# Patient Record
Sex: Male | Born: 1950 | Race: White | Hispanic: No | Marital: Married | Smoking: Never smoker
Health system: Southern US, Community
[De-identification: ages and names within clinical notes are randomized; demographics above are authoritative.]

## PROBLEM LIST (undated history)

## (undated) DIAGNOSIS — G2581 Restless legs syndrome: Secondary | ICD-10-CM

## (undated) DIAGNOSIS — I2510 Atherosclerotic heart disease of native coronary artery without angina pectoris: Secondary | ICD-10-CM

## (undated) DIAGNOSIS — E119 Type 2 diabetes mellitus without complications: Secondary | ICD-10-CM

## (undated) DIAGNOSIS — N189 Chronic kidney disease, unspecified: Secondary | ICD-10-CM

## (undated) DIAGNOSIS — K635 Polyp of colon: Secondary | ICD-10-CM

## (undated) DIAGNOSIS — I10 Essential (primary) hypertension: Secondary | ICD-10-CM

## (undated) DIAGNOSIS — E669 Obesity, unspecified: Secondary | ICD-10-CM

## (undated) DIAGNOSIS — S21332A Puncture wound without foreign body of left front wall of thorax with penetration into thoracic cavity, initial encounter: Secondary | ICD-10-CM

## (undated) DIAGNOSIS — E785 Hyperlipidemia, unspecified: Secondary | ICD-10-CM

## (undated) HISTORY — PX: CORONARY ARTERY BYPASS GRAFT: SHX141

## (undated) HISTORY — DX: Puncture wound without foreign body of left front wall of thorax with penetration into thoracic cavity, initial encounter: S21.332A

## (undated) HISTORY — PX: COLONOSCOPY: SHX174

## (undated) HISTORY — PX: OTHER SURGICAL HISTORY: SHX169

## (undated) HISTORY — PX: TONSILLECTOMY: SUR1361

## (undated) HISTORY — PX: EYE SURGERY: SHX253

---

## 2008-06-29 ENCOUNTER — Emergency Department: Payer: Self-pay | Admitting: Emergency Medicine

## 2012-11-08 DIAGNOSIS — I2510 Atherosclerotic heart disease of native coronary artery without angina pectoris: Secondary | ICD-10-CM | POA: Insufficient documentation

## 2016-05-09 DIAGNOSIS — E1159 Type 2 diabetes mellitus with other circulatory complications: Secondary | ICD-10-CM | POA: Diagnosis present

## 2016-08-15 DIAGNOSIS — I25810 Atherosclerosis of coronary artery bypass graft(s) without angina pectoris: Secondary | ICD-10-CM | POA: Diagnosis present

## 2018-01-05 ENCOUNTER — Ambulatory Visit
Admission: RE | Admit: 2018-01-05 | Payer: Commercial Managed Care - PPO | Source: Ambulatory Visit | Admitting: Internal Medicine

## 2018-01-05 ENCOUNTER — Encounter: Payer: Self-pay | Admitting: Anesthesiology

## 2018-01-05 ENCOUNTER — Encounter: Admission: RE | Payer: Self-pay | Source: Ambulatory Visit

## 2018-01-05 HISTORY — DX: Restless legs syndrome: G25.81

## 2018-01-05 HISTORY — DX: Obesity, unspecified: E66.9

## 2018-01-05 HISTORY — DX: Type 2 diabetes mellitus without complications: E11.9

## 2018-01-05 HISTORY — DX: Essential (primary) hypertension: I10

## 2018-01-05 HISTORY — DX: Chronic kidney disease, unspecified: N18.9

## 2018-01-05 HISTORY — DX: Polyp of colon: K63.5

## 2018-01-05 HISTORY — DX: Hyperlipidemia, unspecified: E78.5

## 2018-01-05 HISTORY — DX: Atherosclerotic heart disease of native coronary artery without angina pectoris: I25.10

## 2018-01-05 SURGERY — COLONOSCOPY WITH PROPOFOL
Anesthesia: General

## 2019-01-19 DIAGNOSIS — I739 Peripheral vascular disease, unspecified: Secondary | ICD-10-CM | POA: Insufficient documentation

## 2020-02-02 ENCOUNTER — Ambulatory Visit: Payer: Medicare Other | Attending: Internal Medicine

## 2020-02-02 ENCOUNTER — Other Ambulatory Visit: Payer: Self-pay

## 2020-02-02 DIAGNOSIS — Z23 Encounter for immunization: Secondary | ICD-10-CM | POA: Insufficient documentation

## 2020-02-02 NOTE — Progress Notes (Signed)
   Covid-19 Vaccination Clinic  Name:  Bob Miller    MRN: [id number] DOB: June 21, 1951  02/02/2020  Bob Miller was observed post Covid-19 immunization for 15 minutes without incidence. He was provided with Vaccine Information Sheet and instruction to access the V-Safe system.   Bob Miller was instructed to call 911 with any severe reactions post vaccine: Marland Kitchen Difficulty breathing  . Swelling of your face and throat  . A fast heartbeat  . A bad rash all over your body  . Dizziness and weakness    Immunizations Administered    Name Date Dose VIS Date Route   Moderna COVID-19 Vaccine 02/02/2020  4:03 PM 0.5 mL 11/28/2019 Intramuscular   Manufacturer: Moderna   Lot: [id number]   [id number]

## 2020-03-05 ENCOUNTER — Ambulatory Visit: Payer: Medicare Other | Attending: Internal Medicine

## 2020-03-05 DIAGNOSIS — Z23 Encounter for immunization: Secondary | ICD-10-CM | POA: Insufficient documentation

## 2020-03-05 NOTE — Progress Notes (Signed)
   Covid-19 Vaccination Clinic  Name:  Bob Miller    MRN: [id number] DOB: March 08, 1951  03/05/2020  [name] was observed post Covid-19 immunization for 15 minutes without incident. He was provided with Vaccine Information Sheet and instruction to access the V-Safe system.   [name] was instructed to call 911 with any severe reactions post vaccine: Marland Kitchen Difficulty breathing  . Swelling of face and throat  . A fast heartbeat  . A bad rash all over body  . Dizziness and weakness   Immunizations Administered    Name Date Dose VIS Date Route   Moderna COVID-19 Vaccine 03/05/2020  3:44 PM 0.5 mL 11/28/2019 Intramuscular   Manufacturer: Moderna   Lot: [id number]   [id number]

## 2020-07-08 ENCOUNTER — Other Ambulatory Visit: Payer: Self-pay

## 2020-07-15 NOTE — Discharge Instructions (Signed)

## 2020-07-17 ENCOUNTER — Encounter
Admission: RE | Disposition: A | Discharge status: Home / Self Care | Payer: Self-pay | Source: Home / Self Care | Attending: Ophthalmology

## 2020-07-17 ENCOUNTER — Other Ambulatory Visit: Payer: Self-pay

## 2020-07-17 ENCOUNTER — Ambulatory Visit: Payer: Medicare Other | Admitting: Anesthesiology

## 2020-07-17 ENCOUNTER — Encounter: Payer: Self-pay | Admitting: Ophthalmology

## 2020-07-17 ENCOUNTER — Ambulatory Visit
Admission: RE | Admit: 2020-07-17 | Discharge: 2020-07-17 | Disposition: A | Discharge status: Home / Self Care | Payer: Medicare Other | Attending: Ophthalmology | Admitting: Ophthalmology

## 2020-07-17 DIAGNOSIS — Z7982 Long term (current) use of aspirin: Secondary | ICD-10-CM | POA: Insufficient documentation

## 2020-07-17 DIAGNOSIS — I252 Old myocardial infarction: Secondary | ICD-10-CM | POA: Insufficient documentation

## 2020-07-17 DIAGNOSIS — Z79899 Other long term (current) drug therapy: Secondary | ICD-10-CM | POA: Diagnosis not present

## 2020-07-17 DIAGNOSIS — E7800 Pure hypercholesterolemia, unspecified: Secondary | ICD-10-CM | POA: Insufficient documentation

## 2020-07-17 DIAGNOSIS — E1136 Type 2 diabetes mellitus with diabetic cataract: Secondary | ICD-10-CM | POA: Insufficient documentation

## 2020-07-17 DIAGNOSIS — Z7984 Long term (current) use of oral hypoglycemic drugs: Secondary | ICD-10-CM | POA: Diagnosis not present

## 2020-07-17 DIAGNOSIS — I10 Essential (primary) hypertension: Secondary | ICD-10-CM | POA: Diagnosis not present

## 2020-07-17 DIAGNOSIS — H9190 Unspecified hearing loss, unspecified ear: Secondary | ICD-10-CM | POA: Insufficient documentation

## 2020-07-17 DIAGNOSIS — Z951 Presence of aortocoronary bypass graft: Secondary | ICD-10-CM | POA: Diagnosis not present

## 2020-07-17 DIAGNOSIS — I2510 Atherosclerotic heart disease of native coronary artery without angina pectoris: Secondary | ICD-10-CM | POA: Diagnosis not present

## 2020-07-17 DIAGNOSIS — H2511 Age-related nuclear cataract, right eye: Secondary | ICD-10-CM | POA: Diagnosis not present

## 2020-07-17 DIAGNOSIS — Z791 Long term (current) use of non-steroidal anti-inflammatories (NSAID): Secondary | ICD-10-CM | POA: Insufficient documentation

## 2020-07-17 DIAGNOSIS — Z955 Presence of coronary angioplasty implant and graft: Secondary | ICD-10-CM | POA: Diagnosis not present

## 2020-07-17 HISTORY — PX: CATARACT EXTRACTION W/PHACO: SHX586

## 2020-07-17 LAB — GLUCOSE, CAPILLARY
Glucose-Capillary: 170 mg/dL — ABNORMAL HIGH (ref 70–99)
Glucose-Capillary: 186 mg/dL — ABNORMAL HIGH (ref 70–99)

## 2020-07-17 SURGERY — PHACOEMULSIFICATION, CATARACT, WITH IOL INSERTION
Anesthesia: Monitor Anesthesia Care | Site: Eye | Laterality: Right

## 2020-07-17 MED ORDER — EPINEPHRINE PF 1 MG/ML IJ SOLN
INTRAOCULAR | Status: DC | PRN
  Administered 2020-07-17: 92 mL via OPHTHALMIC

## 2020-07-17 MED ORDER — MIDAZOLAM HCL 2 MG/2ML IJ SOLN
INTRAMUSCULAR | Status: DC | PRN
  Administered 2020-07-17: 2 mg via INTRAVENOUS

## 2020-07-17 MED ORDER — LACTATED RINGERS IV SOLN: INTRAVENOUS | Status: DC

## 2020-07-17 MED ORDER — NA HYALUR & NA CHOND-NA HYALUR 0.4-0.35 ML IO KIT
PACK | INTRAOCULAR | Status: DC | PRN
  Administered 2020-07-17: 1 mL via INTRAOCULAR

## 2020-07-17 MED ORDER — FENTANYL CITRATE (PF) 100 MCG/2ML IJ SOLN
INTRAMUSCULAR | Status: DC | PRN
  Administered 2020-07-17: 100 ug via INTRAVENOUS

## 2020-07-17 MED ORDER — ARMC OPHTHALMIC DILATING DROPS
1.0000 "application " | OPHTHALMIC | Status: DC | PRN
  Administered 2020-07-17 (×3): 1 via OPHTHALMIC

## 2020-07-17 MED ORDER — TETRACAINE HCL 0.5 % OP SOLN
1.0000 [drp] | OPHTHALMIC | Status: DC | PRN
  Administered 2020-07-17 (×3): 1 [drp] via OPHTHALMIC

## 2020-07-17 MED ORDER — ACETAMINOPHEN 325 MG PO TABS: 325.0000 mg | ORAL_TABLET | ORAL | Status: DC | PRN

## 2020-07-17 MED ORDER — BRIMONIDINE TARTRATE-TIMOLOL 0.2-0.5 % OP SOLN
OPHTHALMIC | Status: DC | PRN
  Administered 2020-07-17: 1 [drp] via OPHTHALMIC

## 2020-07-17 MED ORDER — MOXIFLOXACIN HCL 0.5 % OP SOLN
1.0000 [drp] | OPHTHALMIC | Status: DC | PRN
  Administered 2020-07-17 (×3): 1 [drp] via OPHTHALMIC

## 2020-07-17 MED ORDER — CEFUROXIME OPHTHALMIC INJECTION 1 MG/0.1 ML
INJECTION | OPHTHALMIC | Status: DC | PRN
  Administered 2020-07-17: 0.1 mL via INTRACAMERAL

## 2020-07-17 MED ORDER — LIDOCAINE HCL (PF) 2 % IJ SOLN
INTRAOCULAR | Status: DC | PRN
  Administered 2020-07-17: 1 mL

## 2020-07-17 MED ORDER — ACETAMINOPHEN 160 MG/5ML PO SOLN: 325.0000 mg | ORAL | Status: DC | PRN

## 2020-07-17 SURGICAL SUPPLY — 29 items
CANNULA ANT/CHMB 27G (MISCELLANEOUS) ×1 IMPLANT
CANNULA ANT/CHMB 27GA (MISCELLANEOUS) ×3 IMPLANT
GLOVE SURG LX 7.5 STRW (GLOVE) ×4
GLOVE SURG LX STRL 7.5 STRW (GLOVE) ×1 IMPLANT
GLOVE SURG TRIUMPH 8.0 PF LTX (GLOVE) ×3 IMPLANT
GOWN STRL REUS W/ TWL LRG LVL3 (GOWN DISPOSABLE) ×2 IMPLANT
GOWN STRL REUS W/TWL LRG LVL3 (GOWN DISPOSABLE) ×6
LENS IOL DIOP 20.5 (Intraocular Lens) ×3 IMPLANT
LENS IOL TECNIS MONO 20.5 (Intraocular Lens) IMPLANT
MARKER SKIN DUAL TIP RULER LAB (MISCELLANEOUS) ×3 IMPLANT
NDL CAPSULORHEX 25GA (NEEDLE) ×1 IMPLANT
NDL FILTER BLUNT 18X1 1/2 (NEEDLE) ×2 IMPLANT
NDL RETROBULBAR .5 NSTRL (NEEDLE) IMPLANT
NEEDLE CAPSULORHEX 25GA (NEEDLE) ×3 IMPLANT
NEEDLE FILTER BLUNT 18X 1/2SAF (NEEDLE) ×4
NEEDLE FILTER BLUNT 18X1 1/2 (NEEDLE) ×2 IMPLANT
PACK CATARACT BRASINGTON (MISCELLANEOUS) ×3 IMPLANT
PACK EYE AFTER SURG (MISCELLANEOUS) ×3 IMPLANT
PACK OPTHALMIC (MISCELLANEOUS) ×3 IMPLANT
RING MALYGIN 7.0 (MISCELLANEOUS) IMPLANT
SOLUTION OPHTHALMIC SALT (MISCELLANEOUS) ×3 IMPLANT
SUT ETHILON 10-0 CS-B-6CS-B-6 (SUTURE)
SUT VICRYL  9 0 (SUTURE)
SUT VICRYL 9 0 (SUTURE) IMPLANT
SUTURE EHLN 10-0 CS-B-6CS-B-6 (SUTURE) IMPLANT
SYR 3ML LL SCALE MARK (SYRINGE) ×6 IMPLANT
SYR TB 1ML LUER SLIP (SYRINGE) ×3 IMPLANT
WATER STERILE IRR 250ML POUR (IV SOLUTION) ×3 IMPLANT
WIPE NON LINTING 3.25X3.25 (MISCELLANEOUS) ×3 IMPLANT

## 2020-07-17 NOTE — H&P (Signed)

## 2020-07-17 NOTE — Op Note (Signed)
LOCATION:  [place]   PREOPERATIVE DIAGNOSIS:    Nuclear sclerotic cataract right eye. H25.11   POSTOPERATIVE DIAGNOSIS:  Nuclear sclerotic cataract right eye.     PROCEDURE:  Phacoemusification with posterior chamber intraocular lens placement of the right eye   ULTRASOUND TIME: Procedure(s) with comments: CATARACT EXTRACTION PHACO AND INTRAOCULAR LENS PLACEMENT (IOC) RIGHT DIABETIC (Right) - 6.45 1:30.6 7.1%  LENS:   Implant Name Type Inv. Item Serial No. Manufacturer Lot No. LRB No. Used Action  LENS IOL DIOP 20.5 - [id number] Intraocular Lens LENS IOL DIOP 20.5 [id number] [employer]  Right 1 Implanted         SURGEON:  Wyonia Hough, MD   ANESTHESIA:  Topical with tetracaine drops and 2% Xylocaine jelly, augmented with 1% preservative-free intracameral lidocaine.    COMPLICATIONS:  None.   DESCRIPTION OF PROCEDURE:  The patient was identified in the holding room and transported to the operating room and placed in the supine position under the operating microscope.  The right eye was identified as the operative eye and it was prepped and draped in the usual sterile ophthalmic fashion.   A 1 millimeter clear-corneal paracentesis was made at the 12:00 position.  0.5 ml of preservative-free 1% lidocaine was injected into the anterior chamber. The anterior chamber was filled with Viscoat viscoelastic.  A 2.4 millimeter keratome was used to make a near-clear corneal incision at the 9:00 position.  A curvilinear capsulorrhexis was made with a cystotome and capsulorrhexis forceps.  Balanced salt solution was used to hydrodissect and hydrodelineate the nucleus.   Phacoemulsification was then used in stop and chop fashion to remove the lens nucleus and epinucleus.  The remaining cortex was then removed using the irrigation and aspiration handpiece. Provisc was then placed into the capsular bag to distend it for lens placement.  A lens was then injected  into the capsular bag.  The remaining viscoelastic was aspirated.   Wounds were hydrated with balanced salt solution.  The anterior chamber was inflated to a physiologic pressure with balanced salt solution.  No wound leaks were noted. Cefuroxime 0.1 ml of a 10mg /ml solution was injected into the anterior chamber for a dose of 1 mg of intracameral antibiotic at the completion of the case.   Timolol and Brimonidine drops were applied to the eye.  The patient was taken to the recovery room in stable condition without complications of anesthesia or surgery.   Njeri Vicente 07/17/2020, 8:35 AM

## 2020-07-17 NOTE — Anesthesia Postprocedure Evaluation (Signed)
Anesthesia Post Note  Patient: Bob Miller  Procedure(s) Performed: CATARACT EXTRACTION PHACO AND INTRAOCULAR LENS PLACEMENT (IOC) RIGHT DIABETIC (Right Eye)     Patient location during evaluation: PACU Anesthesia Type: MAC Level of consciousness: awake and alert Pain management: pain level controlled Vital Signs Assessment: post-procedure vital signs reviewed and stable Respiratory status: spontaneous breathing, nonlabored ventilation, respiratory function stable and patient connected to nasal cannula oxygen Cardiovascular status: stable and blood pressure returned to baseline Postop Assessment: no apparent nausea or vomiting Anesthetic complications: no   No complications documented.  Trecia Rogers

## 2020-07-17 NOTE — Anesthesia Preprocedure Evaluation (Signed)
Anesthesia Evaluation  Patient identified by MRN, date of birth, ID band Patient awake    Reviewed: Allergy & Precautions, H&P , NPO status , Patient's Chart, lab work & pertinent test results, reviewed documented beta blocker date and time   Airway Mallampati: II  TM Distance: >3 FB Neck ROM: full    Dental no notable dental hx.    Pulmonary neg pulmonary ROS,    Pulmonary exam normal breath sounds clear to auscultation       Cardiovascular Exercise Tolerance: Good hypertension, + CAD and + Cardiac Stents   Rhythm:regular Rate:Normal     Neuro/Psych negative neurological ROS  negative psych ROS   GI/Hepatic negative GI ROS, Neg liver ROS,   Endo/Other  diabetes  Renal/GU negative Renal ROS  negative genitourinary   Musculoskeletal   Abdominal   Peds  Hematology negative hematology ROS (+)   Anesthesia Other Findings   Reproductive/Obstetrics negative OB ROS                             Anesthesia Physical Anesthesia Plan  ASA: II  Anesthesia Plan: MAC   Post-op Pain Management:    Induction:   PONV Risk Score and Plan:   Airway Management Planned:   Additional Equipment:   Intra-op Plan:   Post-operative Plan:   Informed Consent: I have reviewed the patients History and Physical, chart, labs and discussed the procedure including the risks, benefits and alternatives for the proposed anesthesia with the patient or authorized representative who has indicated his/her understanding and acceptance.     Dental Advisory Given  Plan Discussed with: CRNA and Anesthesiologist  Anesthesia Plan Comments:         Anesthesia Quick Evaluation

## 2020-07-17 NOTE — Transfer of Care (Signed)
Immediate Anesthesia Transfer of Care Note  Patient: Bob Miller  Procedure(s) Performed: CATARACT EXTRACTION PHACO AND INTRAOCULAR LENS PLACEMENT (IOC) RIGHT DIABETIC (Right Eye)  Patient Location: PACU  Anesthesia Type: MAC  Level of Consciousness: awake, alert  and patient cooperative  Airway and Oxygen Therapy: Patient Spontanous Breathing and Patient connected to supplemental oxygen  Post-op Assessment: Post-op Vital signs reviewed, Patient's Cardiovascular Status Stable, Respiratory Function Stable, Patent Airway and No signs of Nausea or vomiting  Post-op Vital Signs: Reviewed and stable  Complications: No complications documented.

## 2020-07-18 ENCOUNTER — Encounter: Payer: Self-pay | Admitting: Ophthalmology

## 2020-08-01 ENCOUNTER — Encounter: Payer: Self-pay | Admitting: Ophthalmology

## 2020-08-01 ENCOUNTER — Other Ambulatory Visit: Payer: Self-pay

## 2020-08-05 ENCOUNTER — Inpatient Hospital Stay: Admission: RE | Admit: 2020-08-05 | Payer: Medicare Other | Source: Ambulatory Visit

## 2020-08-21 ENCOUNTER — Ambulatory Visit: Admit: 2020-08-21 | Payer: Medicare Other | Admitting: Ophthalmology

## 2020-08-21 SURGERY — PHACOEMULSIFICATION, CATARACT, WITH IOL INSERTION
Anesthesia: Topical | Laterality: Left

## 2020-08-28 ENCOUNTER — Encounter: Payer: Self-pay | Admitting: Ophthalmology

## 2020-08-28 ENCOUNTER — Other Ambulatory Visit: Payer: Self-pay

## 2020-08-30 ENCOUNTER — Other Ambulatory Visit: Admission: RE | Admit: 2020-08-30 | Payer: Medicare Other | Source: Ambulatory Visit

## 2020-09-03 ENCOUNTER — Other Ambulatory Visit: Payer: Self-pay

## 2020-09-03 ENCOUNTER — Other Ambulatory Visit
Admission: RE | Admit: 2020-09-03 | Discharge: 2020-09-03 | Disposition: A | Discharge status: Home / Self Care | Payer: Medicare Other | Source: Ambulatory Visit | Attending: Ophthalmology | Admitting: Ophthalmology

## 2020-09-03 DIAGNOSIS — Z01812 Encounter for preprocedural laboratory examination: Secondary | ICD-10-CM | POA: Insufficient documentation

## 2020-09-03 DIAGNOSIS — Z20822 Contact with and (suspected) exposure to covid-19: Secondary | ICD-10-CM | POA: Insufficient documentation

## 2020-09-03 NOTE — Discharge Instructions (Signed)

## 2020-09-04 ENCOUNTER — Encounter: Payer: Self-pay | Admitting: Ophthalmology

## 2020-09-04 ENCOUNTER — Encounter
Admission: RE | Disposition: A | Discharge status: Home / Self Care | Payer: Self-pay | Source: Home / Self Care | Attending: Ophthalmology

## 2020-09-04 ENCOUNTER — Encounter: Payer: Self-pay | Admitting: Anesthesiology

## 2020-09-04 ENCOUNTER — Other Ambulatory Visit: Payer: Self-pay

## 2020-09-04 ENCOUNTER — Ambulatory Visit
Admission: RE | Admit: 2020-09-04 | Discharge: 2020-09-04 | Disposition: A | Discharge status: Home / Self Care | Payer: Medicare Other | Attending: Ophthalmology | Admitting: Ophthalmology

## 2020-09-04 DIAGNOSIS — H5703 Miosis: Secondary | ICD-10-CM | POA: Diagnosis not present

## 2020-09-04 DIAGNOSIS — Z794 Long term (current) use of insulin: Secondary | ICD-10-CM | POA: Insufficient documentation

## 2020-09-04 DIAGNOSIS — M1990 Unspecified osteoarthritis, unspecified site: Secondary | ICD-10-CM | POA: Insufficient documentation

## 2020-09-04 DIAGNOSIS — E1136 Type 2 diabetes mellitus with diabetic cataract: Secondary | ICD-10-CM | POA: Diagnosis present

## 2020-09-04 DIAGNOSIS — Z951 Presence of aortocoronary bypass graft: Secondary | ICD-10-CM | POA: Diagnosis not present

## 2020-09-04 DIAGNOSIS — I2510 Atherosclerotic heart disease of native coronary artery without angina pectoris: Secondary | ICD-10-CM | POA: Insufficient documentation

## 2020-09-04 DIAGNOSIS — H2512 Age-related nuclear cataract, left eye: Secondary | ICD-10-CM | POA: Diagnosis not present

## 2020-09-04 DIAGNOSIS — I252 Old myocardial infarction: Secondary | ICD-10-CM | POA: Insufficient documentation

## 2020-09-04 DIAGNOSIS — Z955 Presence of coronary angioplasty implant and graft: Secondary | ICD-10-CM | POA: Diagnosis not present

## 2020-09-04 DIAGNOSIS — Z7982 Long term (current) use of aspirin: Secondary | ICD-10-CM | POA: Insufficient documentation

## 2020-09-04 DIAGNOSIS — Z720 Tobacco use: Secondary | ICD-10-CM | POA: Insufficient documentation

## 2020-09-04 DIAGNOSIS — E7800 Pure hypercholesterolemia, unspecified: Secondary | ICD-10-CM | POA: Insufficient documentation

## 2020-09-04 DIAGNOSIS — Z79899 Other long term (current) drug therapy: Secondary | ICD-10-CM | POA: Diagnosis not present

## 2020-09-04 HISTORY — PX: CATARACT EXTRACTION W/PHACO: SHX586

## 2020-09-04 LAB — GLUCOSE, CAPILLARY
Glucose-Capillary: 164 mg/dL — ABNORMAL HIGH (ref 70–99)
Glucose-Capillary: 164 mg/dL — ABNORMAL HIGH (ref 70–99)

## 2020-09-04 LAB — SARS CORONAVIRUS 2 (TAT 6-24 HRS): SARS Coronavirus 2: NEGATIVE

## 2020-09-04 SURGERY — PHACOEMULSIFICATION, CATARACT, WITH IOL INSERTION
Anesthesia: Monitor Anesthesia Care | Site: Eye | Laterality: Left

## 2020-09-04 MED ORDER — CEFUROXIME OPHTHALMIC INJECTION 1 MG/0.1 ML
INJECTION | OPHTHALMIC | Status: DC | PRN
  Administered 2020-09-04: 0.1 mL via INTRACAMERAL

## 2020-09-04 MED ORDER — NA HYALUR & NA CHOND-NA HYALUR 0.4-0.35 ML IO KIT
PACK | INTRAOCULAR | Status: DC | PRN
  Administered 2020-09-04: 1 mL via INTRAOCULAR

## 2020-09-04 MED ORDER — LIDOCAINE HCL (PF) 2 % IJ SOLN
INTRAOCULAR | Status: DC | PRN
  Administered 2020-09-04: 1 mL

## 2020-09-04 MED ORDER — MOXIFLOXACIN HCL 0.5 % OP SOLN
1.0000 [drp] | OPHTHALMIC | Status: DC | PRN
  Administered 2020-09-04 (×3): 1 [drp] via OPHTHALMIC

## 2020-09-04 MED ORDER — FENTANYL CITRATE (PF) 100 MCG/2ML IJ SOLN
INTRAMUSCULAR | Status: DC | PRN
Start: 2020-09-04 — End: 2020-09-04
  Administered 2020-09-04 (×2): 50 ug via INTRAVENOUS

## 2020-09-04 MED ORDER — TETRACAINE HCL 0.5 % OP SOLN
1.0000 [drp] | OPHTHALMIC | Status: DC | PRN
  Administered 2020-09-04 (×3): 1 [drp] via OPHTHALMIC

## 2020-09-04 MED ORDER — LACTATED RINGERS IV SOLN: INTRAVENOUS | Status: DC

## 2020-09-04 MED ORDER — EPINEPHRINE PF 1 MG/ML IJ SOLN
INTRAOCULAR | Status: DC | PRN
  Administered 2020-09-04: 70 mL via OPHTHALMIC

## 2020-09-04 MED ORDER — MIDAZOLAM HCL 2 MG/2ML IJ SOLN
INTRAMUSCULAR | Status: DC | PRN
  Administered 2020-09-04 (×2): 1 mg via INTRAVENOUS

## 2020-09-04 MED ORDER — BRIMONIDINE TARTRATE-TIMOLOL 0.2-0.5 % OP SOLN
OPHTHALMIC | Status: DC | PRN
  Administered 2020-09-04: 1 [drp] via OPHTHALMIC

## 2020-09-04 MED ORDER — ARMC OPHTHALMIC DILATING DROPS
1.0000 "application " | OPHTHALMIC | Status: DC | PRN
  Administered 2020-09-04 (×3): 1 via OPHTHALMIC

## 2020-09-04 SURGICAL SUPPLY — 24 items
CANNULA ANT/CHMB 27G (MISCELLANEOUS) ×1 IMPLANT
CANNULA ANT/CHMB 27GA (MISCELLANEOUS) ×3 IMPLANT
GLOVE SURG LX 7.5 STRW (GLOVE) ×2
GLOVE SURG LX STRL 7.5 STRW (GLOVE) ×1 IMPLANT
GLOVE SURG TRIUMPH 8.0 PF LTX (GLOVE) ×3 IMPLANT
GOWN STRL REUS W/ TWL LRG LVL3 (GOWN DISPOSABLE) ×2 IMPLANT
GOWN STRL REUS W/TWL LRG LVL3 (GOWN DISPOSABLE) ×6
LENS IOL DIOP 20.0 (Intraocular Lens) ×3 IMPLANT
LENS IOL TECNIS MONO 20.0 (Intraocular Lens) IMPLANT
MARKER SKIN DUAL TIP RULER LAB (MISCELLANEOUS) ×3 IMPLANT
NDL CAPSULORHEX 25GA (NEEDLE) ×1 IMPLANT
NDL FILTER BLUNT 18X1 1/2 (NEEDLE) ×2 IMPLANT
NEEDLE CAPSULORHEX 25GA (NEEDLE) ×3 IMPLANT
NEEDLE FILTER BLUNT 18X 1/2SAF (NEEDLE) ×4
NEEDLE FILTER BLUNT 18X1 1/2 (NEEDLE) ×2 IMPLANT
PACK CATARACT BRASINGTON (MISCELLANEOUS) ×3 IMPLANT
PACK EYE AFTER SURG (MISCELLANEOUS) ×3 IMPLANT
PACK OPTHALMIC (MISCELLANEOUS) ×3 IMPLANT
RING MALYGIN (MISCELLANEOUS) ×2 IMPLANT
SOLUTION OPHTHALMIC SALT (MISCELLANEOUS) ×3 IMPLANT
SYR 3ML LL SCALE MARK (SYRINGE) ×6 IMPLANT
SYR TB 1ML LUER SLIP (SYRINGE) ×3 IMPLANT
WATER STERILE IRR 250ML POUR (IV SOLUTION) ×3 IMPLANT
WIPE NON LINTING 3.25X3.25 (MISCELLANEOUS) ×3 IMPLANT

## 2020-09-04 NOTE — H&P (Signed)

## 2020-09-04 NOTE — Anesthesia Postprocedure Evaluation (Signed)
Anesthesia Post Note  Patient: Bob Miller  Procedure(s) Performed: CATARACT EXTRACTION PHACO AND INTRAOCULAR LENS PLACEMENT (IOC) LEFT DIABETIC 8.07  00:55.2  14.6% (Left Eye)     Anesthesia Post Evaluation No complications documented.  Kiron Osmun [employer]

## 2020-09-04 NOTE — Anesthesia Preprocedure Evaluation (Signed)
Anesthesia Evaluation  Patient identified by MRN, date of birth, ID band Patient awake    Reviewed: Allergy & Precautions, H&P , NPO status , Patient's Chart, lab work & pertinent test results, reviewed documented beta blocker date and time   Airway Mallampati: II  TM Distance: >3 FB Neck ROM: full    Dental no notable dental hx.    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  Renal/GU CRFRenal disease  negative genitourinary   Musculoskeletal negative musculoskeletal ROS (+)   Abdominal (+) + obese,   Peds  Hematology negative hematology ROS (+)   Anesthesia Other Findings   Reproductive/Obstetrics                             Anesthesia Physical  Anesthesia Plan  ASA: III  Anesthesia Plan: MAC   Post-op Pain Management:    Induction:   PONV Risk Score and Plan: 1 and TIVA, Midazolam and Treatment may vary due to age or medical condition  Airway Management Planned: Natural Airway and Nasal Cannula  Additional Equipment: None  Intra-op Plan:   Post-operative Plan:   Informed Consent: I have reviewed the patients History and Physical, chart, labs and discussed the procedure including the risks, benefits and alternatives for the proposed anesthesia with the patient or authorized representative who has indicated his/her understanding and acceptance.     Dental advisory given  Plan Discussed with: CRNA  Anesthesia Plan Comments:         Anesthesia Quick Evaluation

## 2020-09-04 NOTE — Anesthesia Procedure Notes (Signed)
Procedure Name: MAC Performed by: Kierstynn Babich, CRNA Pre-anesthesia Checklist: Patient identified, Emergency Drugs available, Suction available, Timeout performed and Patient being monitored Patient Re-evaluated:Patient Re-evaluated prior to induction Oxygen Delivery Method: Nasal cannula Placement Confirmation: positive ETCO2       

## 2020-09-04 NOTE — Op Note (Signed)
OPERATIVE NOTE  Bob Miller [id number] 09/04/2020  PREOPERATIVE DIAGNOSIS:   Nuclear sclerotic cataract left eye with miotic pupil      H25.12   POSTOPERATIVE DIAGNOSIS:   Nuclear sclerotic cataract left eye with miotic pupil.     PROCEDURE:  Phacoemulsification with posterior chamber intraocular lens implantation of the left eye which required pupil stretching with the Malyugin pupil expansion device  Ultrasound time: Procedure(s) with comments: CATARACT EXTRACTION PHACO AND INTRAOCULAR LENS PLACEMENT (IOC) LEFT DIABETIC 8.07  00:55.2  14.6% (Left) - Diabetic - insulin and oral meds  LENS:   Implant Name Type Inv. Item Serial No. Manufacturer Lot No. LRB No. Used Action  LENS IOL DIOP 20.0 - [id number] Intraocular Lens LENS IOL DIOP 20.0 [id number] [employer]  Left 1 Implanted     SURGEON:  Wyonia Hough, MD   ANESTHESIA: Topical with tetracaine drops and 2% Xylocaine jelly, augmented with 1% preservative-free intracameral lidocaine.   COMPLICATIONS:  None.   DESCRIPTION OF PROCEDURE:  The patient was identified in the holding room and transported to the operating room and placed in the supine position under the operating microscope.  The left eye was identified as the operative eye and it was prepped and draped in the usual sterile ophthalmic fashion.   A 1 millimeter clear-corneal paracentesis was made at the 1:30 position.  The anterior chamber was filled with Viscoat viscoelastic.  0.5 ml of preservative-free 1% lidocaine was injected into the anterior chamber.  A 2.4 millimeter keratome was used to make a near-clear corneal incision at the 10:30 position.  A Malyugin pupil expander was then placed through the main incision and into the anterior chamber of the eye.  The edge of the iris was secured on the lip of the pupil expander and it was released, thereby expanding the pupil to approximately 6.5 millimeters for completion of the cataract surgery.  Additional  Viscoat was placed in the anterior chamber.  A cystotome and capsulorrhexis forceps were used to make a curvilinear capsulorrhexis.   Balanced salt solution was used to hydrodissect and hydrodelineate the lens nucleus.   Phacoemulsification was used in stop and chop fashion to remove the lens, nucleus and epinucleus.  The remaining cortex was aspirated using the irrigation aspiration handpiece.  Additional Provisc was placed into the eye to distend the capsular bag for lens placement.  A lens was then injected into the capsular bag.  The pupil expanding ring was removed using a Kuglen hook and insertion device. The remaining viscoelastic was aspirated from the capsular bag and the anterior chamber.  The anterior chamber was filled with balanced salt solution to inflate to a physiologic pressure.   Wounds were hydrated with balanced salt solution.  The anterior chamber was inflated to a physiologic pressure with balanced salt solution.  No wound leaks were noted. Cefuroxime 0.1 ml of a 10mg /ml solution was injected into the anterior chamber for a dose of 1 mg of intracameral antibiotic at the completion of the case.   Timolol and Brimonidine drops were applied to the eye.  The patient was taken to the recovery room in stable condition without complications of anesthesia or surgery.  Bob Miller 09/04/2020, 8:27 AM

## 2020-09-04 NOTE — Transfer of Care (Signed)
Immediate Anesthesia Transfer of Care Note  Patient: Bob Miller  Procedure(s) Performed: CATARACT EXTRACTION PHACO AND INTRAOCULAR LENS PLACEMENT (IOC) LEFT DIABETIC 8.07  00:55.2  14.6% (Left Eye)  Patient Location: PACU  Anesthesia Type: MAC  Level of Consciousness: awake, alert  and patient cooperative  Airway and Oxygen Therapy: Patient Spontanous Breathing and Patient connected to supplemental oxygen  Post-op Assessment: Post-op Vital signs reviewed, Patient's Cardiovascular Status Stable, Respiratory Function Stable, Patent Airway and No signs of Nausea or vomiting  Post-op Vital Signs: Reviewed and stable  Complications: No complications documented.

## 2020-09-05 ENCOUNTER — Encounter: Payer: Self-pay | Admitting: Ophthalmology

## 2021-05-01 ENCOUNTER — Other Ambulatory Visit: Admission: RE | Admit: 2021-05-01 | Payer: 59 | Source: Ambulatory Visit

## 2021-05-05 ENCOUNTER — Encounter
Admission: RE | Disposition: A | Discharge status: Home / Self Care | Payer: Self-pay | Source: Home / Self Care | Attending: Gastroenterology

## 2021-05-05 ENCOUNTER — Other Ambulatory Visit: Payer: Self-pay

## 2021-05-05 ENCOUNTER — Ambulatory Visit
Admission: RE | Admit: 2021-05-05 | Discharge: 2021-05-05 | Disposition: A | Discharge status: Home / Self Care | Payer: Medicare Other | Attending: Gastroenterology | Admitting: Gastroenterology

## 2021-05-05 ENCOUNTER — Ambulatory Visit: Payer: Medicare Other | Admitting: Anesthesiology

## 2021-05-05 ENCOUNTER — Encounter: Payer: Self-pay | Admitting: *Deleted

## 2021-05-05 DIAGNOSIS — Z7984 Long term (current) use of oral hypoglycemic drugs: Secondary | ICD-10-CM | POA: Diagnosis not present

## 2021-05-05 DIAGNOSIS — R1310 Dysphagia, unspecified: Secondary | ICD-10-CM | POA: Diagnosis present

## 2021-05-05 DIAGNOSIS — Z1211 Encounter for screening for malignant neoplasm of colon: Secondary | ICD-10-CM | POA: Insufficient documentation

## 2021-05-05 DIAGNOSIS — Z7982 Long term (current) use of aspirin: Secondary | ICD-10-CM | POA: Insufficient documentation

## 2021-05-05 DIAGNOSIS — Z79899 Other long term (current) drug therapy: Secondary | ICD-10-CM | POA: Insufficient documentation

## 2021-05-05 DIAGNOSIS — Z951 Presence of aortocoronary bypass graft: Secondary | ICD-10-CM | POA: Diagnosis not present

## 2021-05-05 DIAGNOSIS — Z7902 Long term (current) use of antithrombotics/antiplatelets: Secondary | ICD-10-CM | POA: Diagnosis not present

## 2021-05-05 DIAGNOSIS — Z791 Long term (current) use of non-steroidal anti-inflammatories (NSAID): Secondary | ICD-10-CM | POA: Insufficient documentation

## 2021-05-05 DIAGNOSIS — Z794 Long term (current) use of insulin: Secondary | ICD-10-CM | POA: Insufficient documentation

## 2021-05-05 DIAGNOSIS — D123 Benign neoplasm of transverse colon: Secondary | ICD-10-CM | POA: Insufficient documentation

## 2021-05-05 DIAGNOSIS — I2510 Atherosclerotic heart disease of native coronary artery without angina pectoris: Secondary | ICD-10-CM | POA: Diagnosis not present

## 2021-05-05 DIAGNOSIS — D122 Benign neoplasm of ascending colon: Secondary | ICD-10-CM | POA: Insufficient documentation

## 2021-05-05 DIAGNOSIS — Z955 Presence of coronary angioplasty implant and graft: Secondary | ICD-10-CM | POA: Insufficient documentation

## 2021-05-05 HISTORY — PX: COLONOSCOPY WITH PROPOFOL: SHX5780

## 2021-05-05 HISTORY — PX: ESOPHAGOGASTRODUODENOSCOPY (EGD) WITH PROPOFOL: SHX5813

## 2021-05-05 LAB — GLUCOSE, CAPILLARY: Glucose-Capillary: 199 mg/dL — ABNORMAL HIGH (ref 70–99)

## 2021-05-05 SURGERY — ESOPHAGOGASTRODUODENOSCOPY (EGD) WITH PROPOFOL
Anesthesia: General

## 2021-05-05 MED ORDER — PROPOFOL 500 MG/50ML IV EMUL
INTRAVENOUS | Status: AC
  Filled 2021-05-05: qty 50

## 2021-05-05 MED ORDER — PROPOFOL 10 MG/ML IV BOLUS
INTRAVENOUS | Status: AC
  Filled 2021-05-05: qty 20

## 2021-05-05 MED ORDER — LIDOCAINE HCL (PF) 2 % IJ SOLN
INTRAMUSCULAR | Status: AC
  Filled 2021-05-05: qty 2

## 2021-05-05 MED ORDER — LIDOCAINE HCL (CARDIAC) PF 100 MG/5ML IV SOSY
PREFILLED_SYRINGE | INTRAVENOUS | Status: DC | PRN
  Administered 2021-05-05: 60 mg via INTRAVENOUS

## 2021-05-05 MED ORDER — PROPOFOL 500 MG/50ML IV EMUL
INTRAVENOUS | Status: DC | PRN
  Administered 2021-05-05: 150 ug/kg/min via INTRAVENOUS

## 2021-05-05 MED ORDER — ONDANSETRON HCL 4 MG/2ML IJ SOLN: INTRAMUSCULAR | Status: DC | PRN

## 2021-05-05 MED ORDER — SODIUM CHLORIDE 0.9 % IV SOLN
INTRAVENOUS | Status: DC
  Administered 2021-05-05: 1000 mL via INTRAVENOUS

## 2021-05-05 NOTE — Interval H&P Note (Signed)
History and Physical Interval Note:  05/05/2021 10:04 AM  Bob Miller  has presented today for surgery, with the diagnosis of History of polyps, GERD, pharyngoesophageal dysphagia.  The various methods of treatment have been discussed with the patient and family. After consideration of risks, benefits and other options for treatment, the patient has consented to  Procedure(s) with comments: ESOPHAGOGASTRODUODENOSCOPY (EGD) WITH PROPOFOL (N/A) - DM COLONOSCOPY WITH PROPOFOL (N/A) as a surgical intervention.  The patient's history has been reviewed, patient examined, no change in status, stable for surgery.  I have reviewed the patient's chart and labs.  Questions were answered to the patient's satisfaction.     Lesly Rubenstein  Ok to proceed with EGD/Colonoscopy

## 2021-05-05 NOTE — H&P (Signed)
Outpatient short stay form Pre-procedure 05/05/2021 9:56 AM Raylene Miyamoto MD, MPH  Primary Physician: Dr. Celesta Aver  Reason for visit:  Dysphagia/Surveillance  History of present illness:   70 y/o gentleman with history of CAD on plavix with last dose 5 days ago per patient here for EGD/Colonoscopy. States he has chronic dysphagia to solids and liquids which has been going on for years. Had colonoscopy years ago with 5 polyps found. No abdominal surgeries. No family history of GI malignancies.    Current Facility-Administered Medications:  .  0.9 %  sodium chloride infusion, , Intravenous, Continuous, Westlee Devita, Hilton Cork, MD, Last Rate: 20 mL/hr at 05/05/21 0955, 1,000 mL at 05/05/21 0955  Medications Prior to Admission  Medication Sig Dispense Refill Last Dose  . amLODipine (NORVASC) 10 MG tablet Take 10 mg by mouth daily.   Past Week at Unknown time  . aspirin EC 81 MG tablet Take 81 mg by mouth daily.   Past Month at Unknown time  . co-enzyme Q-10 30 MG capsule Take 100 mg by mouth daily.    05/04/2021 at Unknown time  . Cyanocobalamin (VITAMIN B-12) 2500 MCG SUBL Place under the tongue.    05/04/2021 at Unknown time  . doxazosin (CARDURA) 2 MG tablet Take 2 mg by mouth daily.    Past Week at Unknown time  . glimepiride (AMARYL) 1 MG tablet Take 1 mg by mouth daily with breakfast.   Past Week at Unknown time  . glucosamine-chondroitin 500-400 MG tablet Take 1 tablet by mouth daily.   05/04/2021 at Unknown time  . Insulin Glargine (BASAGLAR KWIKPEN [place]) Inject 15 Units into the skin at bedtime.   Past Week at Unknown time  . isosorbide mononitrate (IMDUR) 30 MG 24 hr tablet Take 30 mg by mouth daily.   Past Week at Unknown time  . meloxicam (MOBIC) 15 MG tablet Take 15 mg by mouth daily.   Past Week at Unknown time  . metFORMIN (GLUCOPHAGE) 500 MG tablet Take by mouth daily with breakfast.    Past Week at Unknown time  . pantoprazole (PROTONIX) 40 MG tablet Take 40 mg by mouth daily.   Past  Week at Unknown time  . pioglitazone (ACTOS) 45 MG tablet Take 45 mg by mouth daily.    Past Week at Unknown time  . ramipril (ALTACE) 10 MG capsule Take 10 mg by mouth daily.   Past Week at Unknown time  . rosuvastatin (CRESTOR) 40 MG tablet Take 40 mg by mouth daily.   Past Week at Unknown time  . clopidogrel (PLAVIX) 75 MG tablet Take 75 mg by mouth daily.   05/01/2021  . metoprolol tartrate (LOPRESSOR) 50 MG tablet Take 50 mg by mouth 2 (two) times daily.     . nitroGLYCERIN (NITROSTAT) 0.4 MG SL tablet Place 0.4 mg under the tongue every 5 (five) minutes as needed for chest pain.     Marland Kitchen omeprazole (PRILOSEC) 40 MG capsule Take 40 mg by mouth daily.        Allergies  Allergen Reactions  . Codeine     anxiety  . Crestor [Rosuvastatin]     Muscle pain  . Lipitor [Atorvastatin]     Muscle pain  . Tramadol      Past Medical History:  Diagnosis Date  . Chronic kidney disease    stage 2  . Colon polyps   . Coronary artery disease   . Diabetes mellitus without complication ([place])   . Hyperlipidemia   .  Hypertension   . Obesity   . Restless leg syndrome     Review of systems:  Otherwise negative.    Physical Exam  Gen: Alert, oriented. Appears stated age.  HEENT: PERRLA. Lungs: No respiratory distress CV: RRR Abd: soft, benign, no masses Ext: No edema    Planned procedures: Proceed with EGD/colonoscopy. The patient understands the nature of the planned procedure, indications, risks, alternatives and potential complications including but not limited to bleeding, infection, perforation, damage to internal organs and possible oversedation/side effects from anesthesia. The patient agrees and gives consent to proceed.  Please refer to procedure notes for findings, recommendations and patient disposition/instructions.     Raylene Miyamoto MD, MPH Gastroenterology 05/05/2021  9:56 AM

## 2021-05-05 NOTE — Transfer of Care (Signed)
Immediate Anesthesia Transfer of Care Note  Patient: Bob Miller  Procedure(s) Performed: ESOPHAGOGASTRODUODENOSCOPY (EGD) WITH PROPOFOL (N/A ) COLONOSCOPY WITH PROPOFOL (N/A )  Patient Location: PACU  Anesthesia Type:General  Level of Consciousness: awake and sedated  Airway & Oxygen Therapy: Patient Spontanous Breathing and Patient connected to nasal cannula oxygen  Post-op Assessment: Report given to RN and Post -op Vital signs reviewed and stable  Post vital signs: Reviewed and stable  Last Vitals:  Vitals Value Taken Time  BP    Temp    Pulse    Resp    SpO2      Last Pain:  Vitals:   05/05/21 0938  TempSrc: Temporal  PainSc: 0-No pain         Complications: No complications documented.

## 2021-05-05 NOTE — Anesthesia Procedure Notes (Signed)
Performed by: Cook-Martin, Sharonann Malbrough Pre-anesthesia Checklist: Patient identified, Emergency Drugs available, Suction available, Patient being monitored and Timeout performed Patient Re-evaluated:Patient Re-evaluated prior to induction Oxygen Delivery Method: Simple face mask Preoxygenation: Pre-oxygenation with 100% oxygen Induction Type: IV induction Airway Equipment and Method: Bite block Placement Confirmation: positive ETCO2 and CO2 detector       

## 2021-05-05 NOTE — Op Note (Signed)
[facility] Gastroenterology Patient Name: Bob Miller Procedure Date: 05/05/2021 10:03 AM MRN: [id number] Account #: [id number] Date of Birth: 11-12-1951 Admit Type: Outpatient Age: 70 Room: [facility] ENDO ROOM 3 Gender: Male Note Status: Finalized Procedure:             Upper GI endoscopy Indications:           Dysphagia Providers:             Andrey Farmer MD, MD Referring MD:          No Local Md, MD (Referring MD) Medicines:             Monitored Anesthesia Care Complications:         No immediate complications. Estimated blood loss:                         Minimal. Procedure:             Pre-Anesthesia Assessment:                        - Prior to the procedure, a History and Physical was                         performed, and patient medications and allergies were                         reviewed. The patient is competent. The risks and                         benefits of the procedure and the sedation options and                         risks were discussed with the patient. All questions                         were answered and informed consent was obtained.                         Patient identification and proposed procedure were                         verified by the physician, the nurse, the anesthetist                         and the technician in the endoscopy suite. Mental                         Status Examination: alert and oriented. Airway                         Examination: normal oropharyngeal airway and neck                         mobility. Respiratory Examination: clear to                         auscultation. CV Examination: normal. Prophylactic                         Antibiotics:  The patient does not require prophylactic                         antibiotics. Prior Anticoagulants: The patient has                         taken Plavix (clopidogrel), last dose was 5 days prior                         to procedure. ASA Grade Assessment: II - A  patient                         with mild systemic disease. After reviewing the risks                         and benefits, the patient was deemed in satisfactory                         condition to undergo the procedure. The anesthesia                         plan was to use monitored anesthesia care (MAC).                         Immediately prior to administration of medications,                         the patient was re-assessed for adequacy to receive                         sedatives. The heart rate, respiratory rate, oxygen                         saturations, blood pressure, adequacy of pulmonary                         ventilation, and response to care were monitored                         throughout the procedure. The physical status of the                         patient was re-assessed after the procedure.                        After obtaining informed consent, the endoscope was                         passed under direct vision. Throughout the procedure,                         the patient's blood pressure, pulse, and oxygen                         saturations were monitored continuously. The Endoscope                         was introduced through the mouth, and advanced to  the                         second part of duodenum. The upper GI endoscopy was                         accomplished without difficulty. The patient tolerated                         the procedure well. Findings:      No endoscopic abnormality was evident in the esophagus to explain the       patient's complaint of dysphagia. Biopsies were obtained from the       proximal and distal esophagus with cold forceps for histology of       suspected eosinophilic esophagitis. Estimated blood loss was minimal.      Localized mild mucosal changes characterized by erythema were found in       the gastric body. Biopsies were taken with a cold forceps for histology.       Estimated blood loss was minimal.      The  examined duodenum was normal. Impression:            - No endoscopic esophageal abnormality to explain                         patient's dysphagia. Biopsied.                        - Erythematous mucosa in the gastric body. Biopsied.                        - Normal examined duodenum. Recommendation:        - Perform a colonoscopy today. Procedure Code(s):     --- Professional ---                        [phone], Esophagogastroduodenoscopy, flexible,                         transoral; with biopsy, single or multiple Diagnosis Code(s):     --- Professional ---                        R13.10, Dysphagia, unspecified                        K31.89, Other diseases of stomach and duodenum CPT copyright 2019 American Medical Association. All rights reserved. The codes documented in this report are preliminary and upon coder review may  be revised to meet current compliance requirements. Andrey Farmer MD, MD 05/05/2021 10:40:51 AM Number of Addenda: 0 Note Initiated On: 05/05/2021 10:03 AM Estimated Blood Loss:  Estimated blood loss was minimal.      [facility]

## 2021-05-05 NOTE — Op Note (Signed)
[facility] Gastroenterology Patient Name: Bob Miller Procedure Date: 05/05/2021 10:01 AM MRN: [id number] Account #: [id number] Date of Birth: 1951-11-18 Admit Type: Outpatient Age: 70 Room: [facility] ENDO ROOM 3 Gender: Male Note Status: Finalized Procedure:             Colonoscopy Indications:           Surveillance: Personal history of adenomatous polyps                         on last colonoscopy > 5 years ago Providers:             Andrey Farmer MD, MD Referring MD:          No Local Md, MD (Referring MD) Medicines:             Monitored Anesthesia Care Complications:         No immediate complications. Estimated blood loss:                         Minimal. Procedure:             Pre-Anesthesia Assessment:                        - Prior to the procedure, a History and Physical was                         performed, and patient medications and allergies were                         reviewed. The patient is competent. The risks and                         benefits of the procedure and the sedation options and                         risks were discussed with the patient. All questions                         were answered and informed consent was obtained.                         Patient identification and proposed procedure were                         verified by the physician, the nurse, the anesthetist                         and the technician in the endoscopy suite. Mental                         Status Examination: alert and oriented. Airway                         Examination: normal oropharyngeal airway and neck                         mobility. Respiratory Examination: clear to  auscultation. CV Examination: normal. Prophylactic                         Antibiotics: The patient does not require prophylactic                         antibiotics. Prior Anticoagulants: The patient has                         taken Plavix (clopidogrel),  last dose was 5 days prior                         to procedure. ASA Grade Assessment: II - A patient                         with mild systemic disease. After reviewing the risks                         and benefits, the patient was deemed in satisfactory                         condition to undergo the procedure. The anesthesia                         plan was to use monitored anesthesia care (MAC).                         Immediately prior to administration of medications,                         the patient was re-assessed for adequacy to receive                         sedatives. The heart rate, respiratory rate, oxygen                         saturations, blood pressure, adequacy of pulmonary                         ventilation, and response to care were monitored                         throughout the procedure. The physical status of the                         patient was re-assessed after the procedure.                        After obtaining informed consent, the colonoscope was                         passed under direct vision. Throughout the procedure,                         the patient's blood pressure, pulse, and oxygen                         saturations were monitored continuously. The  Colonoscope was introduced through the anus and                         advanced to the the cecum, identified by appendiceal                         orifice and ileocecal valve. The colonoscopy was                         performed without difficulty. The patient tolerated                         the procedure well. The quality of the bowel                         preparation was good. Findings:      The perianal and digital rectal examinations were normal.      A 3 mm polyp was found in the ascending colon. The polyp was sessile.       The polyp was removed with a cold snare. Resection and retrieval were       complete. Estimated blood loss was minimal.      A 3 mm  polyp was found in the hepatic flexure. The polyp was sessile.       The polyp was removed with a cold snare. Resection and retrieval were       complete. Estimated blood loss was minimal.      The exam was otherwise without abnormality on direct and retroflexion       views. Impression:            - One 3 mm polyp in the ascending colon, removed with                         a cold snare. Resected and retrieved.                        - One 3 mm polyp at the hepatic flexure, removed with                         a cold snare. Resected and retrieved.                        - The examination was otherwise normal on direct and                         retroflexion views. Recommendation:        - Discharge patient to home.                        - Resume previous diet.                        - Resume Plavix (clopidogrel) at prior dose today.                        - Await pathology results.                        - Repeat colonoscopy for surveillance based on  pathology results.                        - Return to referring physician as previously                         scheduled. Procedure Code(s):     --- Professional ---                        [phone], Colonoscopy, flexible; with removal of                         tumor(s), polyp(s), or other lesion(s) by snare                         technique Diagnosis Code(s):     --- Professional ---                        Z86.010, Personal history of colonic polyps                        K63.5, Polyp of colon CPT copyright 2019 American Medical Association. All rights reserved. The codes documented in this report are preliminary and upon coder review may  be revised to meet current compliance requirements. Andrey Farmer MD, MD 05/05/2021 10:43:36 AM Number of Addenda: 0 Note Initiated On: 05/05/2021 10:01 AM Scope Withdrawal Time: 0 hours 11 minutes 36 seconds  Total Procedure Duration: 0 hours 16 minutes 5 seconds  Estimated  Blood Loss:  Estimated blood loss was minimal.      [facility]

## 2021-05-05 NOTE — Anesthesia Postprocedure Evaluation (Signed)
Anesthesia Post Note  Patient: Bob Miller  Procedure(s) Performed: ESOPHAGOGASTRODUODENOSCOPY (EGD) WITH PROPOFOL (N/A ) COLONOSCOPY WITH PROPOFOL (N/A )  Patient location during evaluation: Phase II Anesthesia Type: General Level of consciousness: awake and alert, awake and oriented Pain management: pain level controlled Vital Signs Assessment: post-procedure vital signs reviewed and stable Respiratory status: spontaneous breathing, nonlabored ventilation and respiratory function stable Cardiovascular status: blood pressure returned to baseline and stable Postop Assessment: no apparent nausea or vomiting Anesthetic complications: no   No complications documented.   Last Vitals:  Vitals:   05/05/21 0938 05/05/21 1039  BP: 103/81 130/61  Pulse: 65 62  Resp: 18 18  Temp: (!) 36.1 C   SpO2: 99% 95%    Last Pain:  Vitals:   05/05/21 1039  TempSrc:   PainSc: 0-No pain                 Phill Mutter

## 2021-05-05 NOTE — Anesthesia Preprocedure Evaluation (Signed)
Anesthesia Evaluation  Patient identified by MRN, date of birth, ID band Patient awake    Reviewed: Allergy & Precautions, H&P , NPO status , Patient's Chart, lab work & pertinent test results, reviewed documented beta blocker date and time   Airway Mallampati: II  TM Distance: >3 FB Neck ROM: full    Dental no notable dental hx. (+) Upper Dentures, Lower Dentures   Pulmonary neg pulmonary ROS,    Pulmonary exam normal        Cardiovascular hypertension, Pt. on medications and Pt. on home beta blockers + CAD, + Cardiac Stents and + CABG  Normal cardiovascular exam Rhythm:regular Rate:Normal     Neuro/Psych negative neurological ROS  negative psych ROS   GI/Hepatic Neg liver ROS, Bowel prep,GERD  ,  Endo/Other  diabetes, Type 2, Insulin DependentBMI 33  Renal/GU CRFRenal disease  negative genitourinary   Musculoskeletal negative musculoskeletal ROS (+)   Abdominal (+) + obese,   Peds  Hematology negative hematology ROS (+)   Anesthesia Other Findings    Reproductive/Obstetrics                             Anesthesia Physical  Anesthesia Plan  ASA: III  Anesthesia Plan: General   Post-op Pain Management:    Induction: Intravenous  PONV Risk Score and Plan: 1 and TIVA, Treatment may vary due to age or medical condition and Propofol infusion  Airway Management Planned: Natural Airway and Nasal Cannula  Additional Equipment: None  Intra-op Plan:   Post-operative Plan:   Informed Consent: I have reviewed the patients History and Physical, chart, labs and discussed the procedure including the risks, benefits and alternatives for the proposed anesthesia with the patient or authorized representative who has indicated his/her understanding and acceptance.     Dental advisory given  Plan Discussed with: CRNA, Anesthesiologist and Surgeon  Anesthesia Plan Comments:          Anesthesia Quick Evaluation

## 2021-05-06 ENCOUNTER — Encounter: Payer: Self-pay | Admitting: Gastroenterology

## 2021-05-06 LAB — SURGICAL PATHOLOGY

## 2021-05-14 ENCOUNTER — Other Ambulatory Visit: Payer: Self-pay | Admitting: Gastroenterology

## 2021-05-14 DIAGNOSIS — R1310 Dysphagia, unspecified: Secondary | ICD-10-CM

## 2021-05-19 ENCOUNTER — Ambulatory Visit
Admission: RE | Admit: 2021-05-19 | Discharge: 2021-05-19 | Disposition: A | Discharge status: Home / Self Care | Payer: Medicare Other | Source: Ambulatory Visit | Attending: Gastroenterology | Admitting: Gastroenterology

## 2021-05-19 ENCOUNTER — Other Ambulatory Visit: Payer: Self-pay

## 2021-05-19 DIAGNOSIS — R1310 Dysphagia, unspecified: Secondary | ICD-10-CM | POA: Insufficient documentation

## 2021-11-06 ENCOUNTER — Ambulatory Visit (INDEPENDENT_AMBULATORY_CARE_PROVIDER_SITE_OTHER): Payer: Medicare Other | Admitting: Urology

## 2021-11-06 ENCOUNTER — Other Ambulatory Visit: Payer: Self-pay

## 2021-11-06 ENCOUNTER — Encounter: Payer: Self-pay | Admitting: Urology

## 2021-11-06 VITALS — BP 160/91 | HR 60 | Ht 70.0 in | Wt 230.0 lb

## 2021-11-06 DIAGNOSIS — N401 Enlarged prostate with lower urinary tract symptoms: Secondary | ICD-10-CM

## 2021-11-06 LAB — URINALYSIS, COMPLETE
Bilirubin, UA: NEGATIVE
Leukocytes,UA: NEGATIVE
Nitrite, UA: NEGATIVE
Specific Gravity, UA: >1.03 — ABNORMAL HIGH (ref 1.005–1.030)
Urobilinogen, Ur: 1 mg/dL (ref 0.2–1.0)
pH, UA: 5.5 (ref 5.0–7.5)

## 2021-11-06 LAB — MICROSCOPIC EXAMINATION: Bacteria, UA: NONE SEEN

## 2021-11-06 MED ORDER — TAMSULOSIN HCL 0.4 MG PO CAPS
0.4000 mg | ORAL_CAPSULE | Freq: Every day | ORAL | 0 refills | Status: DC
Start: 2021-11-06 — End: 2021-12-08

## 2021-11-06 NOTE — Progress Notes (Signed)
11/06/2021 9:11 AM   Bob Miller 1951/12/01 [id number]  Referring provider: Iva Boop, MD [address] [place],  [place] [id number]  Chief Complaint  Patient presents with   Other    HPI: Bob Miller is a 70 y.o. male referred for evaluation of lower urinary tract symptoms  Several year history of lower urinary tract symptoms including urinary frequency, urgency, intermittent/weak urinary stream and urinary hesitancy Has nocturia x4 with voiding small amounts Denies dysuria, gross hematuria or UTI No flank, abdominal or pelvic pain Has been on doxazosin for several years at a 2 mg dose.  States he is on this for prostate and hypertension States he saw urologist around 25 years ago but does not remember why he was seen [facility] today 32/35    PMH: Past Medical History:  Diagnosis Date   Chronic kidney disease    stage 2   Colon polyps    Coronary artery disease    Diabetes mellitus without complication ([place])    Hyperlipidemia    Hypertension    Obesity    Restless leg syndrome     Surgical History: Past Surgical History:  Procedure Laterality Date   CATARACT EXTRACTION W/PHACO Right 07/17/2020   Procedure: CATARACT EXTRACTION PHACO AND INTRAOCULAR LENS PLACEMENT ([place]) RIGHT DIABETIC;  Surgeon: Leandrew Koyanagi, MD;  Location: [facility];  Service: Ophthalmology;  Laterality: Right;  6.45 1:30.6 7.1%   CATARACT EXTRACTION W/PHACO Left 09/04/2020   Procedure: CATARACT EXTRACTION PHACO AND INTRAOCULAR LENS PLACEMENT ([facility]) LEFT DIABETIC 8.07  00:55.2  14.6%;  Surgeon: Leandrew Koyanagi, MD;  Location: [facility];  Service: Ophthalmology;  Laterality: Left;  Diabetic - insulin and oral meds   COLONOSCOPY     COLONOSCOPY WITH PROPOFOL N/A 05/05/2021   Procedure: COLONOSCOPY WITH PROPOFOL;  Surgeon: Lesly Rubenstein, MD;  Location: [facility] ENDOSCOPY;  Service: Endoscopy;  Laterality: N/A;   CORONARY ARTERY BYPASS GRAFT      ESOPHAGOGASTRODUODENOSCOPY (EGD) WITH PROPOFOL N/A 05/05/2021   Procedure: ESOPHAGOGASTRODUODENOSCOPY (EGD) WITH PROPOFOL;  Surgeon: Lesly Rubenstein, MD;  Location: [facility] ENDOSCOPY;  Service: Endoscopy;  Laterality: N/A;  DM   EYE SURGERY     pci with stents     TONSILLECTOMY      Home Medications:  Allergies as of 11/06/2021       Reactions   Codeine    anxiety   Crestor [rosuvastatin]    Muscle pain   Lipitor [atorvastatin]    Muscle pain   Tramadol         Medication List        Accurate as of November 06, 2021  9:11 AM. If you have any questions, ask your nurse or doctor.          amLODipine 10 MG tablet Commonly known as: NORVASC Take 10 mg by mouth daily.   aspirin EC 81 MG tablet Take 81 mg by mouth daily.   BASAGLAR KWIKPEN [place] Inject 15 Units into the skin at bedtime.   clopidogrel 75 MG tablet Commonly known as: PLAVIX Take 75 mg by mouth daily.   co-enzyme Q-10 30 MG capsule Take 100 mg by mouth daily.   doxazosin 2 MG tablet Commonly known as: CARDURA Take 2 mg by mouth daily.   glimepiride 1 MG tablet Commonly known as: AMARYL Take 1 mg by mouth daily with breakfast.   glucosamine-chondroitin 500-400 MG tablet Take 1 tablet by mouth daily.   isosorbide mononitrate 30 MG 24 hr tablet Commonly  known as: IMDUR Take 30 mg by mouth daily.   meloxicam 15 MG tablet Commonly known as: MOBIC Take 15 mg by mouth daily.   metFORMIN 500 MG tablet Commonly known as: GLUCOPHAGE Take by mouth daily with breakfast.   metoprolol tartrate 50 MG tablet Commonly known as: LOPRESSOR Take 50 mg by mouth 2 (two) times daily.   nitroGLYCERIN 0.4 MG SL tablet Commonly known as: NITROSTAT Place 0.4 mg under the tongue every 5 (five) minutes as needed for chest pain.   omeprazole 40 MG capsule Commonly known as: PRILOSEC Take 40 mg by mouth daily.   pantoprazole 40 MG tablet Commonly known as: PROTONIX Take 40 mg by mouth daily.   pioglitazone  45 MG tablet Commonly known as: ACTOS Take 45 mg by mouth daily.   ramipril 10 MG capsule Commonly known as: ALTACE Take 10 mg by mouth daily.   rosuvastatin 40 MG tablet Commonly known as: CRESTOR Take 40 mg by mouth daily.   tamsulosin 0.4 MG Caps capsule Commonly known as: FLOMAX Take 1 capsule (0.4 mg total) by mouth daily. Started by: Abbie Sons, MD   Vitamin B-12 2500 MCG Subl Place under the tongue.        Allergies:  Allergies  Allergen Reactions   Codeine     anxiety   Crestor [Rosuvastatin]     Muscle pain   Lipitor [Atorvastatin]     Muscle pain   Tramadol     Family History: History reviewed. No pertinent family history.  Social History:  reports that he has never smoked. His smokeless tobacco use includes snuff. He reports that he does not currently use alcohol. He reports that he does not use drugs.   Physical Exam: BP (!) 160/91   Pulse 60   Ht 5\' 10"  (1.778 m)   Wt 230 lb (104.3 kg)   BMI 33.00 kg/m   Constitutional:  Alert and oriented, No acute distress. HEENT: [place] AT, moist mucus membranes.  Trachea midline, no masses. Cardiovascular: No clubbing, cyanosis, or edema. Respiratory: Normal respiratory effort, no increased work of breathing. GI: Abdomen is soft, nontender, nondistended, no abdominal masses GU: Prostate 40 g, smooth without nodules Skin: No rashes, bruises or suspicious lesions. Neurologic: Grossly intact, no focal deficits, moving all 4 extremities. Psychiatric: Normal mood and affect.   Assessment & Plan:    1.  BPH with severe LUTS Will add tamsulosin 0.4 mg daily Follow-up 1 month for symptom reassessment and bladder scan for PVR  2.  Prostate cancer screening Record review with no recent PSA We discussed the recommended screening guidelines prostate cancer between the ages of 46-69 with an annual PSA/DRE PSA on follow-up   Abbie Sons, MD  [facility] [address],  [place], [place] [address] [phone]

## 2021-11-30 ENCOUNTER — Other Ambulatory Visit: Payer: Self-pay | Admitting: Urology

## 2021-12-08 ENCOUNTER — Ambulatory Visit (INDEPENDENT_AMBULATORY_CARE_PROVIDER_SITE_OTHER): Payer: Medicare Other | Admitting: Urology

## 2021-12-08 ENCOUNTER — Encounter: Payer: Self-pay | Admitting: Urology

## 2021-12-08 ENCOUNTER — Other Ambulatory Visit: Payer: Self-pay

## 2021-12-08 VITALS — BP 146/58 | HR 64 | Ht 70.0 in | Wt 235.0 lb

## 2021-12-08 DIAGNOSIS — N401 Enlarged prostate with lower urinary tract symptoms: Secondary | ICD-10-CM | POA: Diagnosis not present

## 2021-12-08 LAB — BLADDER SCAN AMB NON-IMAGING: Scan Result: 0

## 2021-12-08 MED ORDER — TAMSULOSIN HCL 0.4 MG PO CAPS: 0.4000 mg | ORAL_CAPSULE | Freq: Every day | ORAL | 3 refills | Status: DC

## 2021-12-08 NOTE — Progress Notes (Signed)
12/08/2021 1:50 PM   Bob Miller [birth year] [id number]  Referring provider: Iva Boop, MD [place],  [place] [id number]  Chief Complaint  Patient presents with   Benign Prostatic Hypertrophy    HPI: 70 y.o. male presents for follow-up visit.  Initially seen 11/06/2021 for lower urinary tract symptoms IPSS 32/35 Was on low-dose doxazosin for hypertension and tamsulosin added Benign DRE Feels voiding symptoms have improved on tamsulosin however he has trouble remembering taking the medication 30 minutes after eating and has not been taking consistently PSA 03/14/2021 was 0.61   PMH: Past Medical History:  Diagnosis Date   Chronic kidney disease    stage 2   Colon polyps    Coronary artery disease    Diabetes mellitus without complication ([place])    Hyperlipidemia    Hypertension    Obesity    Restless leg syndrome     Surgical History: Past Surgical History:  Procedure Laterality Date   CATARACT EXTRACTION W/PHACO Right 07/17/2020   Procedure: CATARACT EXTRACTION PHACO AND INTRAOCULAR LENS PLACEMENT ([place]) RIGHT DIABETIC;  Surgeon: Leandrew Koyanagi, MD;  Location: [facility];  Service: Ophthalmology;  Laterality: Right;  6.45 1:30.6 7.1%   CATARACT EXTRACTION W/PHACO Left 09/04/2020   Procedure: CATARACT EXTRACTION PHACO AND INTRAOCULAR LENS PLACEMENT ([facility]) LEFT DIABETIC 8.07  00:55.2  14.6%;  Surgeon: Leandrew Koyanagi, MD;  Location: [facility];  Service: Ophthalmology;  Laterality: Left;  Diabetic - insulin and oral meds   COLONOSCOPY     COLONOSCOPY WITH PROPOFOL N/A 05/05/2021   Procedure: COLONOSCOPY WITH PROPOFOL;  Surgeon: Lesly Rubenstein, MD;  Location: [facility];  Service: Endoscopy;  Laterality: N/A;   CORONARY ARTERY BYPASS GRAFT     ESOPHAGOGASTRODUODENOSCOPY (EGD) WITH PROPOFOL N/A 05/05/2021   Procedure: ESOPHAGOGASTRODUODENOSCOPY (EGD) WITH PROPOFOL;  Surgeon: Lesly Rubenstein, MD;   Location: [facility];  Service: Endoscopy;  Laterality: N/A;  DM   EYE SURGERY     pci with stents     TONSILLECTOMY      Home Medications:  Allergies as of 12/08/2021       Reactions   Codeine    anxiety   Crestor [rosuvastatin]    Muscle pain   Lipitor [atorvastatin]    Muscle pain   Tramadol         Medication List        Accurate as of December 08, 2021  1:50 PM. If you have any questions, ask your nurse or doctor.          amLODipine 10 MG tablet Commonly known as: NORVASC Take 10 mg by mouth daily.   aspirin EC 81 MG tablet Take 81 mg by mouth daily.   BASAGLAR KWIKPEN [place] Inject 15 Units into the skin at bedtime.   clopidogrel 75 MG tablet Commonly known as: PLAVIX Take 75 mg by mouth daily.   co-enzyme Q-10 30 MG capsule Take 100 mg by mouth daily.   doxazosin 2 MG tablet Commonly known as: CARDURA Take 2 mg by mouth daily.   glimepiride 1 MG tablet Commonly known as: AMARYL Take 1 mg by mouth daily with breakfast.   glucosamine-chondroitin 500-400 MG tablet Take 1 tablet by mouth daily.   isosorbide mononitrate 30 MG 24 hr tablet Commonly known as: IMDUR Take 30 mg by mouth daily.   meloxicam 15 MG tablet Commonly known as: MOBIC Take 15 mg by mouth daily.   metFORMIN 500 MG tablet Commonly known as: GLUCOPHAGE  Take by mouth daily with breakfast.   metoprolol tartrate 50 MG tablet Commonly known as: LOPRESSOR Take 50 mg by mouth 2 (two) times daily.   nitroGLYCERIN 0.4 MG SL tablet Commonly known as: NITROSTAT Place 0.4 mg under the tongue every 5 (five) minutes as needed for chest pain.   omeprazole 40 MG capsule Commonly known as: PRILOSEC Take 40 mg by mouth daily.   pantoprazole 40 MG tablet Commonly known as: PROTONIX Take 40 mg by mouth daily.   pioglitazone 45 MG tablet Commonly known as: ACTOS Take 45 mg by mouth daily.   ramipril 10 MG capsule Commonly known as: ALTACE Take 10 mg by mouth daily.    rosuvastatin 40 MG tablet Commonly known as: CRESTOR Take 40 mg by mouth daily.   tamsulosin 0.4 MG Caps capsule Commonly known as: FLOMAX Take 1 capsule (0.4 mg total) by mouth daily.   Vitamin B-12 2500 MCG Subl Place under the tongue.        Allergies:  Allergies  Allergen Reactions   Codeine     anxiety   Crestor [Rosuvastatin]     Muscle pain   Lipitor [Atorvastatin]     Muscle pain   Tramadol     Family History: No family history on file.  Social History:  reports that he has never smoked. His smokeless tobacco use includes snuff. He reports that he does not currently use alcohol. He reports that he does not use drugs.   Physical Exam: BP (!) 146/58   Pulse 64   Ht 5\' 10"  (1.778 m)   Wt 235 lb (106.6 kg)   BMI 33.72 kg/m   Constitutional:  Alert and oriented, No acute distress. HEENT: [place] AT, moist mucus membranes.  Trachea midline, no masses. Cardiovascular: No clubbing, cyanosis, or edema. Respiratory: Normal respiratory effort, no increased work of breathing. Psychiatric: Normal mood and affect.   Assessment & Plan:    1.  BPH with LUTS Symptom improved on tamsulosin He may take with his a.m. meds since his compliance has not been good taking 30 minutes after eating He has not had any side effects but did note slight dizziness today.  If this persists he was instructed to call and we will give a trial of silodosin Follow-up 6 months or earlier for any significant change in his voiding pattern   Abbie Sons, MD  [facility] [address] [place], [place] [address] [phone]

## 2022-04-07 DIAGNOSIS — E1142 Type 2 diabetes mellitus with diabetic polyneuropathy: Secondary | ICD-10-CM | POA: Insufficient documentation

## 2022-04-07 DIAGNOSIS — E1122 Type 2 diabetes mellitus with diabetic chronic kidney disease: Secondary | ICD-10-CM | POA: Diagnosis present

## 2022-04-27 DIAGNOSIS — N1832 Chronic kidney disease, stage 3b: Secondary | ICD-10-CM | POA: Insufficient documentation

## 2022-05-13 ENCOUNTER — Encounter: Payer: Medicare Other | Attending: Endocrinology | Admitting: *Deleted

## 2022-05-13 ENCOUNTER — Encounter: Payer: Self-pay | Admitting: *Deleted

## 2022-05-13 VITALS — BP 100/60 | Ht 70.5 in | Wt 227.6 lb

## 2022-05-13 DIAGNOSIS — Z713 Dietary counseling and surveillance: Secondary | ICD-10-CM | POA: Diagnosis not present

## 2022-05-13 DIAGNOSIS — Z6832 Body mass index (BMI) 32.0-32.9, adult: Secondary | ICD-10-CM | POA: Diagnosis not present

## 2022-05-13 DIAGNOSIS — E1165 Type 2 diabetes mellitus with hyperglycemia: Secondary | ICD-10-CM

## 2022-05-13 DIAGNOSIS — E119 Type 2 diabetes mellitus without complications: Secondary | ICD-10-CM | POA: Insufficient documentation

## 2022-05-13 DIAGNOSIS — Z794 Long term (current) use of insulin: Secondary | ICD-10-CM

## 2022-05-13 NOTE — Patient Instructions (Signed)
Check blood sugars 4 x day before each meal and before bed every day ?Bring blood sugar records to the next class ? ?Exercise:  Walk as tolerated - try to get up and move every hour ? ?Eat 3 meals day,   1-2  snacks a day ?Space meals 4-6 hours apart ?Limit fried foods ?Include 1 serving of protein when eating fruit for a snack and avoid fruit at bedtime ? ?Carry fast acting glucose and a snack at all times ?Rotate injection sites ? ?Return for classes on:    ?

## 2022-05-13 NOTE — Progress Notes (Signed)
Diabetes Self-Management Education  Visit Type: First/Initial  Appt. Start Time: 0850 Appt. End Time: 1000  05/13/2022  [name], identified by name and date of birth, is a 71 y.o. male with a diagnosis of Diabetes: Type 2.   ASSESSMENT  Blood pressure 100/60, height 5' 10.5" (1.791 m), weight 227 lb 9.6 oz (103.2 kg). Body mass index is 32.2 kg/m.   Diabetes Self-Management Education - 05/13/22 1004       Visit Information   Visit Type First/Initial      Initial Visit   Diabetes Type Type 2    Date Diagnosed 27 years ago    Are you currently following a meal plan? No    Are you taking your medications as prescribed? Yes      Health Coping   How would you rate your overall health? Poor      Psychosocial Assessment   Patient Belief/Attitude about Diabetes Other (comment)   "not happy"   What is the hardest part about your diabetes right now, causing you the most concern, or is the most worrisome to you about your diabetes?   Checking blood sugar    Self-care barriers None    Self-management support Doctor's office;Family    Patient Concerns Nutrition/Meal planning;Glycemic Control;Medication;Monitoring    Special Needs None    Preferred Learning Style Visual    Learning Readiness Contemplating    How often do you need to have someone help you when you read instructions, pamphlets, or other written materials from your doctor or pharmacy? 1 - Never    What is the last grade level you completed in school? 12th      Pre-Education Assessment   Patient understands the diabetes disease and treatment process. Needs Instruction    Patient understands incorporating nutritional management into lifestyle. Needs Instruction    Patient undertands incorporating physical activity into lifestyle. Needs Instruction    Patient understands using medications safely. Needs Review    Patient understands monitoring blood glucose, interpreting and using results Needs Review    Patient  understands prevention, detection, and treatment of acute complications. Needs Review    Patient understands prevention, detection, and treatment of chronic complications. Needs Review    Patient understands how to develop strategies to address psychosocial issues. Needs Instruction    Patient understands how to develop strategies to promote health/change behavior. Needs Instruction      Complications   Last HgB A1C per patient/outside source 11 %   05/01/2022   How often do you check your blood sugar? 0 times/day (not testing)   He has a glucometer but hasn't started checking his blood sugar yet.   Number of hypoglycemic episodes per month --   He denies hypoglycemia but reports he has glucose tablets to use.   Have you had a dilated eye exam in the past 12 months? Yes    Have you had a dental exam in the past 12 months? No   dentures   Are you checking your feet? Yes    How many days per week are you checking your feet? 7      Dietary Intake   Breakfast eggs and 1-2 pieces of toast; sometimes sausage    Snack (morning) reports 1-2 snacks/day - peanut butter crackers    Lunch eats out 5 x week - subway 6 inch with tuna and lettuce or meatball sub; burger and fries; pizza    Dinner beef, occasional chicken, pork, fish; potatoes, peas, beans, corn, rice,  green beans, salads with lettuce tomatoes and cuccumbers, fat free dressing    Snack (evening) sometimes fruit at night - grapes, apple, banana    Beverage(s) water, unsweet tea, tomato juice, diet soda      Activity / Exercise   Activity / Exercise Type ADL's      Patient Education   Previous Diabetes Education No    Disease Pathophysiology Definition of diabetes, type 1 and 2, and the diagnosis of diabetes;Factors that contribute to the development of diabetes;Explored patient's options for treatment of their diabetes    Healthy Eating Role of diet in the treatment of diabetes and the relationship between the three main macronutrients and  blood glucose level;Food label reading, portion sizes and measuring food.;Reviewed blood glucose goals for pre and post meals and how to evaluate the patients' food intake on their blood glucose level.    Being Active Role of exercise on diabetes management, blood pressure control and cardiac health.    Medications Taught/reviewed insulin/injectables, injection, site rotation, insulin/injectables storage and needle disposal.;Reviewed patients medication for diabetes, action, purpose, timing of dose and side effects.    Monitoring Purpose and frequency of SMBG.;Taught/discussed recording of test results and interpretation of SMBG.;Identified appropriate SMBG and/or A1C goals.    Acute complications Taught prevention, symptoms, and  treatment of hypoglycemia - the 15 rule.    Chronic complications Relationship between chronic complications and blood glucose control    Diabetes Stress and Support Identified and addressed patients feelings and concerns about diabetes      Individualized Goals (developed by patient)   Reducing Risk Other (comment)   improve blood sugars, decrease medications, prevent diabetes complications     Outcomes   Expected Outcomes Demonstrated interest in learning. Expect positive outcomes    Future DMSE 4-6 wks         Individualized Plan for Diabetes Self-Management Training:   Learning Objective:  Patient will have a greater understanding of diabetes self-management. Patient education plan is to attend individual and/or group sessions per assessed needs and concerns.   Plan:   Patient Instructions  Check blood sugars 4 x day before each meal and before bed every day Bring blood sugar records to the next class  Exercise:  Walk as tolerated - try to get up and move every hour  Eat 3 meals day,   1-2  snacks a day Space meals 4-6 hours apart Limit fried foods Include 1 serving of protein when eating fruit for a snack and avoid fruit at bedtime  Carry fast  acting glucose and a snack at all times Rotate injection sites  Return for classes on:     Expected Outcomes:  Demonstrated interest in learning. Expect positive outcomes  Education material provided:  General Meal Planning Guidelines Simple Meal Plan Symptoms, causes and treatments of Hypoglycemia   If problems or questions, patient to contact team via:   Johny Drilling, RN, [place] [phone]  Future DSME appointment: 4-6 wks June 11, 2022 for Diabetes Class 1

## 2022-05-16 IMAGING — RF DG ESOPHAGUS
8 of 9 series · 14 of 24 positions shown · non-contrast
Comparison: None.

CLINICAL DATA: Dysphagia

EXAM:
ESOPHOGRAM / BARIUM SWALLOW / BARIUM TABLET STUDY
TECHNIQUE: Combined double contrast and single contrast examination performed
using effervescent crystals, thick barium liquid, and thin barium
liquid. The patient was observed with fluoroscopy swallowing a 13 mm
barium sulphate tablet.
FLUOROSCOPY TIME:  Fluoroscopy Time:
Radiation Exposure Index (if provided by the fluoroscopic device):
42.3 mGy
Number of Acquired Spot Images: 0

[Series 1: cp_standard · 0.26mm/px · 2 of 56 frames shown (1 of 8)]
[frame 2/56]
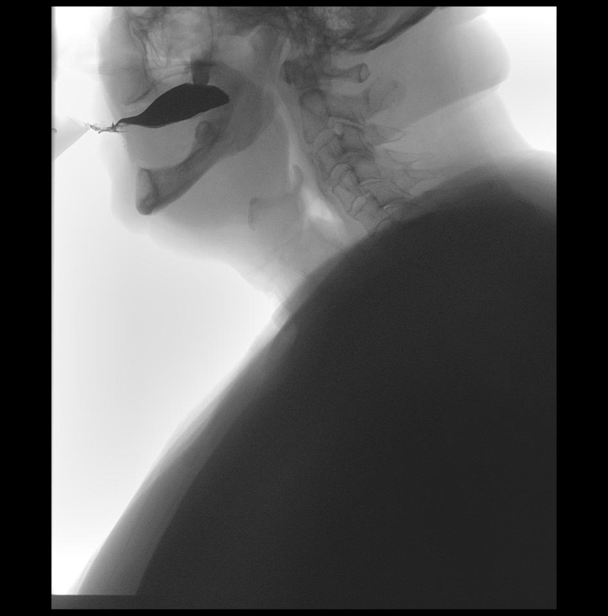
[frame 48/56]
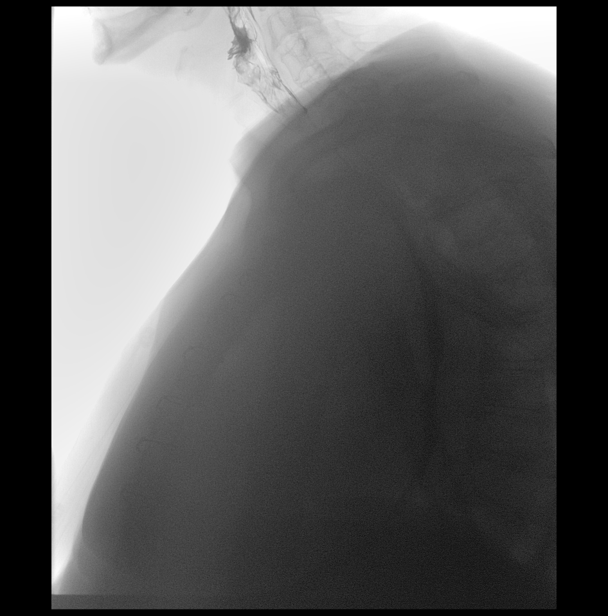

[Series 2: cp_standard · 0.26mm/px · 1 of 82 frames shown (2 of 8)]
[frame 13/82]
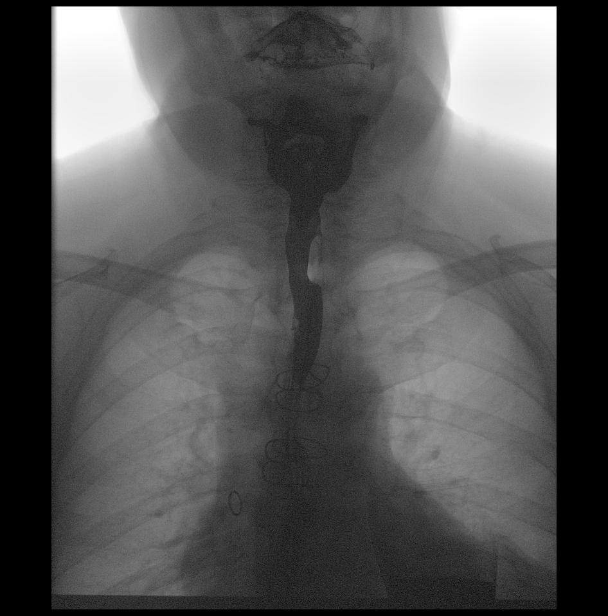

[Series 3: cp_standard · 0.26mm/px · 3 of 327 frames shown (3 of 8)]
[frame 50/327]
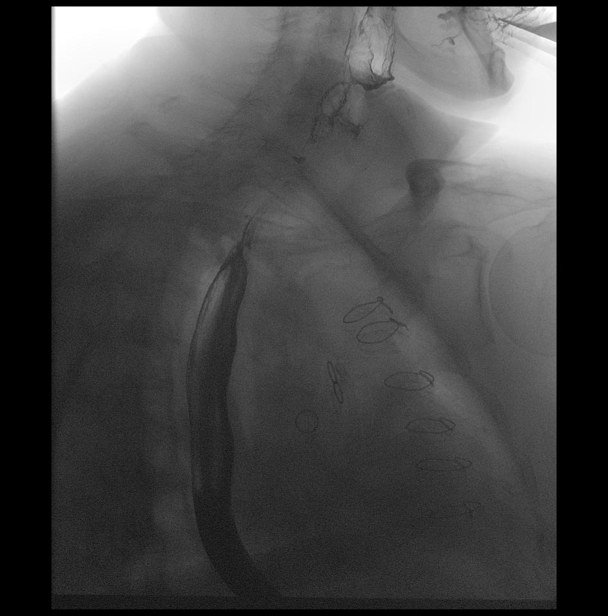
[frame 68/327]
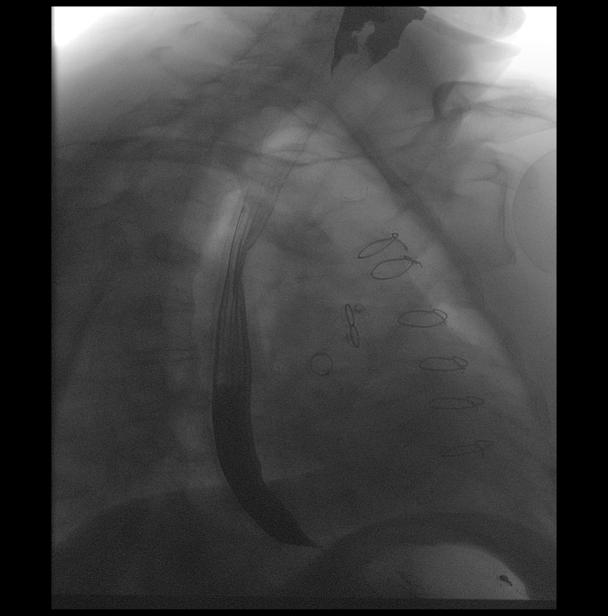
[frame 278/327]
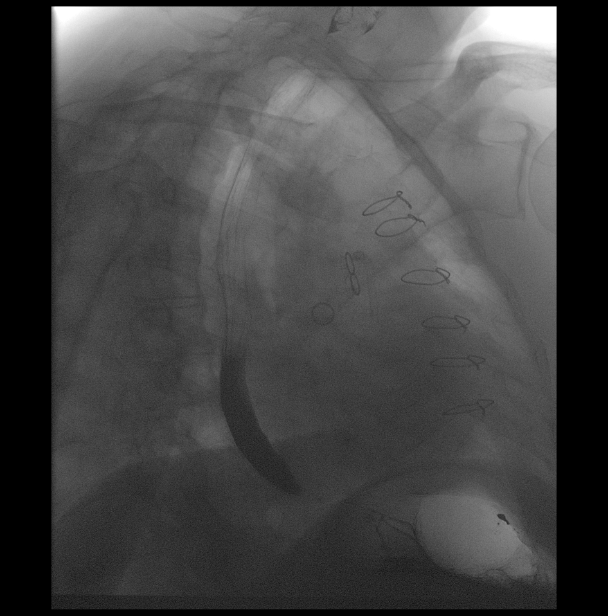

[Series 4: cp_standard · 0.27mm/px · 2 of 178 frames shown (4 of 8)]
[frame 96/178]
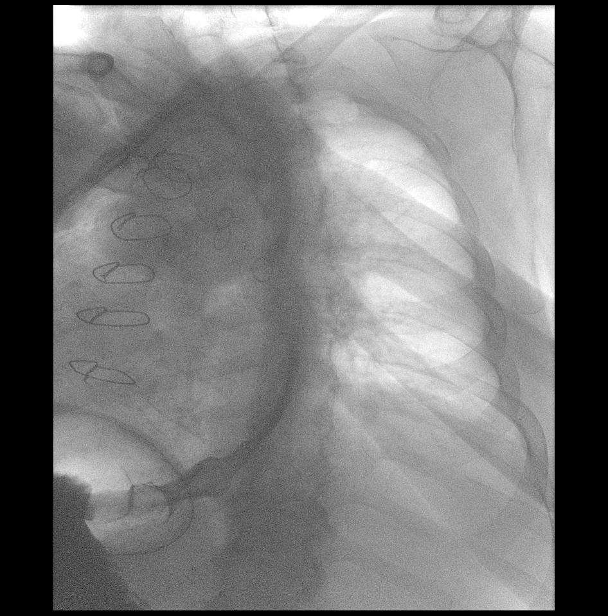
[frame 152/178]
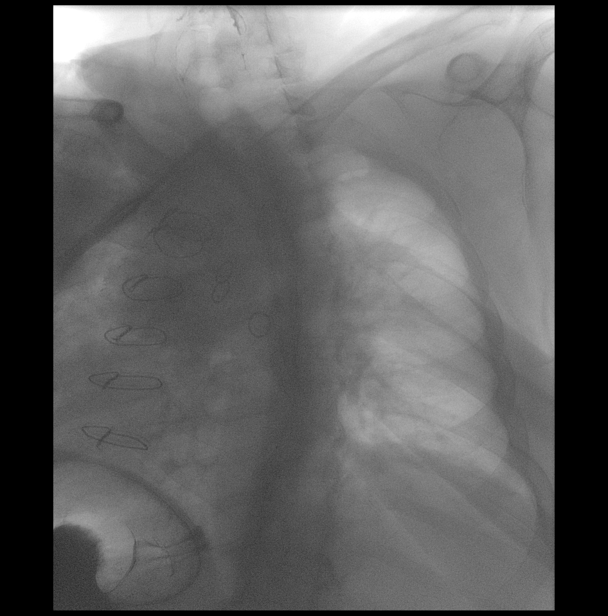

[Series 5: cp_standard · 0.27mm/px · 1 of 240 frames shown (5 of 8)]
[frame 205/240]
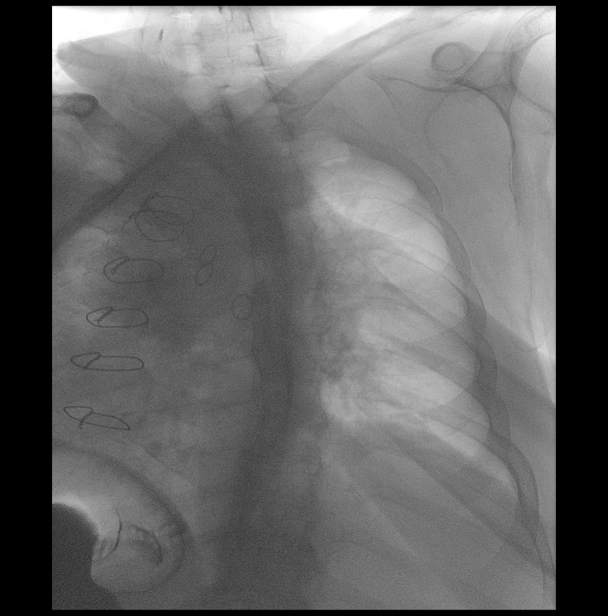

[Series 6: cp_standard · 0.27mm/px · 1 of 1 slices shown (6 of 8)]
[im 1/1]
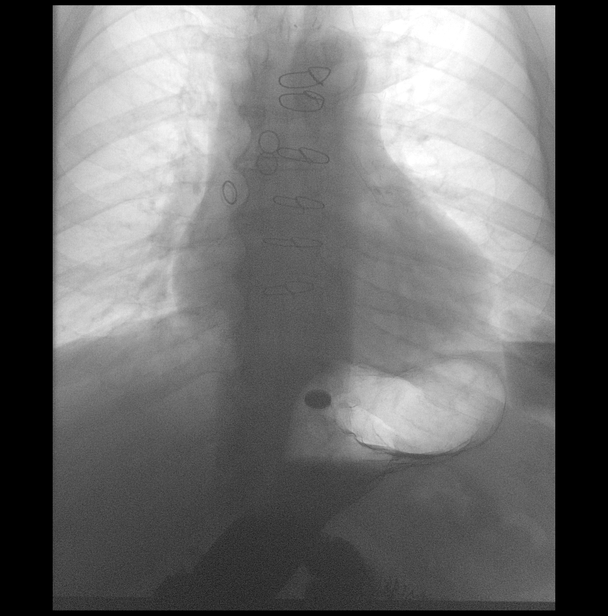

[Series 8: cp_standard · 0.27mm/px · 2 of 25 frames shown (7 of 8)]
[frame 13/25]
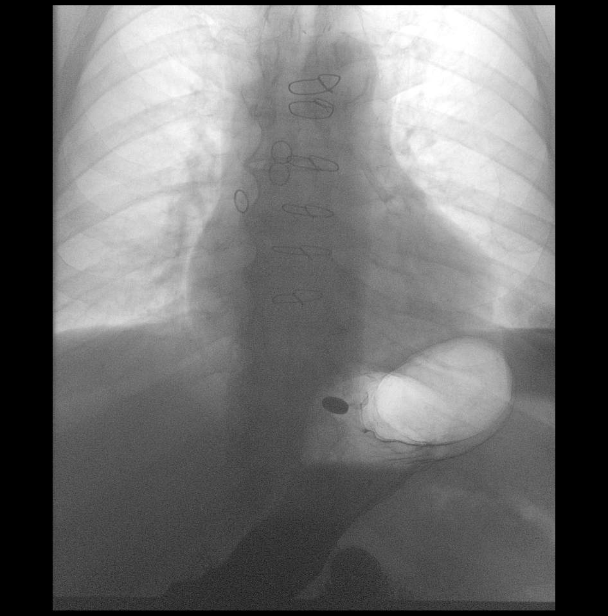
[frame 18/25]
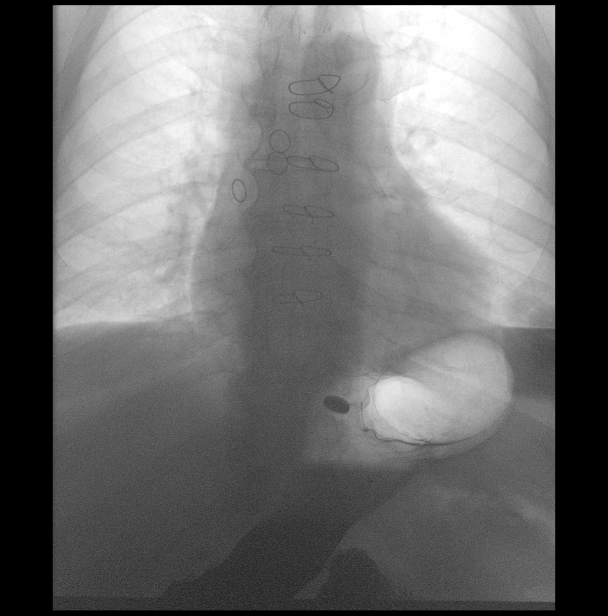

[Series 9: cp_standard · 0.27mm/px · 2 of 99 frames shown (8 of 8)]
[frame 15/99]
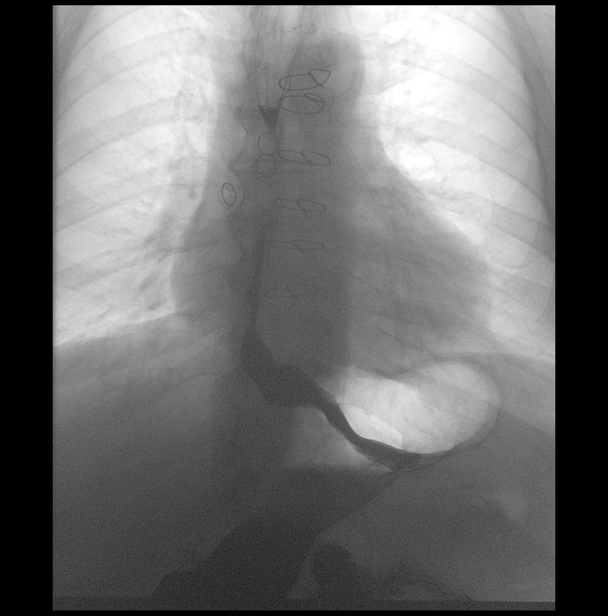
[frame 85/99]
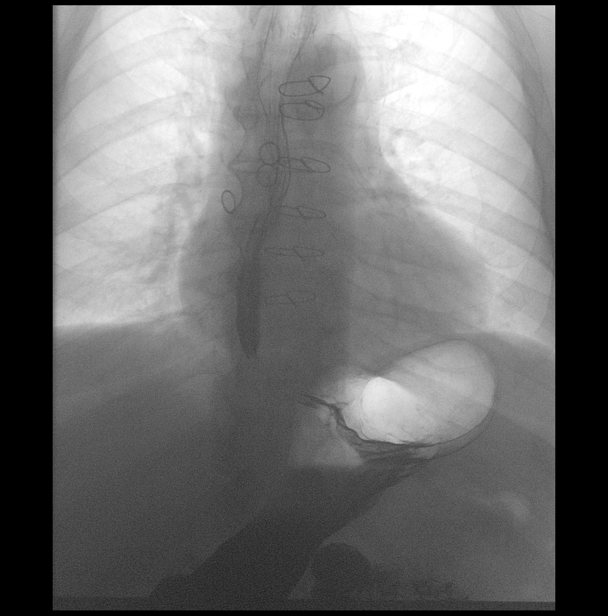

[14 of 24 positions shown; findings below may reference images not displayed]

FINDINGS: Normal pharyngeal anatomy and motility. Contrast flowed freely
through the esophagus without evidence of a stricture or mass.
Normal esophageal mucosa without evidence of irregularity or
ulceration. Esophageal motility was normal. No definite hiatal
hernia was demonstrated. Spontaneous gastroesophageal reflux
extending to the thoracic inlet

At the end of the examination a 13 mm barium tablet was administered
which transited through the esophagus and esophagogastric junction
without delay.
IMPRESSION: 1.  Gastroesophageal reflux extending to the thoracic inlet .

2.  Otherwise normal barium swallow

## 2022-05-17 ENCOUNTER — Emergency Department: Payer: Medicare Other

## 2022-05-17 ENCOUNTER — Encounter: Payer: Self-pay | Admitting: Emergency Medicine

## 2022-05-17 ENCOUNTER — Other Ambulatory Visit: Payer: Self-pay

## 2022-05-17 ENCOUNTER — Emergency Department
Admission: EM | Admit: 2022-05-17 | Discharge: 2022-05-17 | Disposition: A | Discharge status: Home / Self Care | Payer: Medicare Other | Attending: Emergency Medicine | Admitting: Emergency Medicine

## 2022-05-17 DIAGNOSIS — Z7982 Long term (current) use of aspirin: Secondary | ICD-10-CM | POA: Insufficient documentation

## 2022-05-17 DIAGNOSIS — R5383 Other fatigue: Secondary | ICD-10-CM | POA: Diagnosis not present

## 2022-05-17 DIAGNOSIS — E1122 Type 2 diabetes mellitus with diabetic chronic kidney disease: Secondary | ICD-10-CM | POA: Diagnosis not present

## 2022-05-17 DIAGNOSIS — Z20822 Contact with and (suspected) exposure to covid-19: Secondary | ICD-10-CM | POA: Diagnosis not present

## 2022-05-17 DIAGNOSIS — R0602 Shortness of breath: Secondary | ICD-10-CM | POA: Diagnosis not present

## 2022-05-17 DIAGNOSIS — Z951 Presence of aortocoronary bypass graft: Secondary | ICD-10-CM | POA: Diagnosis not present

## 2022-05-17 DIAGNOSIS — R42 Dizziness and giddiness: Secondary | ICD-10-CM | POA: Diagnosis not present

## 2022-05-17 DIAGNOSIS — N189 Chronic kidney disease, unspecified: Secondary | ICD-10-CM | POA: Diagnosis not present

## 2022-05-17 DIAGNOSIS — R944 Abnormal results of kidney function studies: Secondary | ICD-10-CM | POA: Insufficient documentation

## 2022-05-17 DIAGNOSIS — R001 Bradycardia, unspecified: Secondary | ICD-10-CM | POA: Diagnosis not present

## 2022-05-17 DIAGNOSIS — I129 Hypertensive chronic kidney disease with stage 1 through stage 4 chronic kidney disease, or unspecified chronic kidney disease: Secondary | ICD-10-CM | POA: Diagnosis not present

## 2022-05-17 DIAGNOSIS — R079 Chest pain, unspecified: Secondary | ICD-10-CM | POA: Insufficient documentation

## 2022-05-17 LAB — BASIC METABOLIC PANEL
Anion gap: 7 (ref 5–15)
BUN: 31 mg/dL — ABNORMAL HIGH (ref 8–23)
CO2: 23 mmol/L (ref 22–32)
Calcium: 8.6 mg/dL — ABNORMAL LOW (ref 8.9–10.3)
Chloride: 106 mmol/L (ref 98–111)
Creatinine, Ser: 2.04 mg/dL — ABNORMAL HIGH (ref 0.61–1.24)
GFR, Estimated: 35 mL/min — ABNORMAL LOW (ref >60)
Glucose, Bld: 332 mg/dL — ABNORMAL HIGH (ref 70–99)
Potassium: 4.3 mmol/L (ref 3.5–5.1)
Sodium: 136 mmol/L (ref 135–145)

## 2022-05-17 LAB — URINALYSIS, ROUTINE W REFLEX MICROSCOPIC
Bilirubin Urine: NEGATIVE
Glucose, UA: >=500 mg/dL — AB
Ketones, ur: NEGATIVE mg/dL
Leukocytes,Ua: NEGATIVE
Nitrite: NEGATIVE
Protein, ur: 100 mg/dL — AB
Specific Gravity, Urine: 1.022 (ref 1.005–1.030)
pH: 5 (ref 5.0–8.0)

## 2022-05-17 LAB — HEPATIC FUNCTION PANEL
ALT: 20 U/L (ref 0–44)
AST: 19 U/L (ref 15–41)
Albumin: 3.3 g/dL — ABNORMAL LOW (ref 3.5–5.0)
Alkaline Phosphatase: 44 U/L (ref 38–126)
Bilirubin, Direct: 0.1 mg/dL (ref 0.0–0.2)
Indirect Bilirubin: 1 mg/dL — ABNORMAL HIGH (ref 0.3–0.9)
Total Bilirubin: 1.1 mg/dL (ref 0.3–1.2)
Total Protein: 5.9 g/dL — ABNORMAL LOW (ref 6.5–8.1)

## 2022-05-17 LAB — T4, FREE: Free T4: 0.86 ng/dL (ref 0.61–1.12)

## 2022-05-17 LAB — CBC
HCT: 37.1 % — ABNORMAL LOW (ref 39.0–52.0)
Hemoglobin: 12 g/dL — ABNORMAL LOW (ref 13.0–17.0)
MCH: 26.7 pg (ref 26.0–34.0)
MCHC: 32.3 g/dL (ref 30.0–36.0)
MCV: 82.6 fL (ref 80.0–100.0)
Platelets: 165 10*3/uL (ref 150–400)
RBC: 4.49 MIL/uL (ref 4.22–5.81)
RDW: 14.6 % (ref 11.5–15.5)
WBC: 8.5 10*3/uL (ref 4.0–10.5)
nRBC: 0 % (ref 0.0–0.2)

## 2022-05-17 LAB — TROPONIN I (HIGH SENSITIVITY)
Troponin I (High Sensitivity): 17 ng/L (ref <18)
Troponin I (High Sensitivity): 19 ng/L — ABNORMAL HIGH (ref <18)

## 2022-05-17 LAB — BRAIN NATRIURETIC PEPTIDE: B Natriuretic Peptide: 46.7 pg/mL (ref 0.0–100.0)

## 2022-05-17 LAB — TSH: TSH: 1.652 u[IU]/mL (ref 0.350–4.500)

## 2022-05-17 LAB — RESP PANEL BY RT-PCR (FLU A&B, COVID) ARPGX2
Influenza A by PCR: NEGATIVE
Influenza B by PCR: NEGATIVE
SARS Coronavirus 2 by RT PCR: NEGATIVE

## 2022-05-17 MED ORDER — SODIUM CHLORIDE 0.9 % IV BOLUS
500.0000 mL | Freq: Once | INTRAVENOUS | Status: AC
  Administered 2022-05-17: 500 mL via INTRAVENOUS

## 2022-05-17 MED ORDER — ISOSORBIDE MONONITRATE ER 60 MG PO TB24
30.0000 mg | ORAL_TABLET | Freq: Once | ORAL | Status: AC
  Administered 2022-05-17: 30 mg via ORAL
  Filled 2022-05-17: qty 1

## 2022-05-17 NOTE — ED Notes (Signed)
Patient transported to CT 

## 2022-05-17 NOTE — Discharge Instructions (Signed)
Please call with Dr. Bea Laura  office tomorrow and see if they can get you in  tomorrow per Dr. Clayborn Bigness- You can take the Imdur (isosorbide mononitrate) 60 mg tomorrow if you cannot see him until Tuesday to help with any chest discomfort per Dr. Clayborn Bigness.  Return to the ER if you develop worsening symptoms or any other concerns

## 2022-05-17 NOTE — ED Triage Notes (Addendum)
Pt to ED via [facility] from home for chest pain that radiates into the left shoulder and arm for the last 12 hours. Pt states that he called EMS today because he felt like he was going to pass out. Pt has been having weakness and fatigue for the last 3 days. Pt has hx/o DM, bypass surgery and multiple cardiac stents. Pt is A & O at this time and is currently in NAD. Pt had a stress test done on Monday but he has not gotten the results back yet.  Pt was given 2 sprays of nitro, 324 mg of ASA, and 500 mL bolus on NS in route.

## 2022-05-17 NOTE — ED Provider Notes (Signed)
[facility] Provider Note    Event Date/Time   First MD Initiated Contact with Patient 05/17/22 1526     (approximate)   History   Chest Pain   HPI  Bob Miller is a 71 y.o. male with coronary disease status post bypass, hypertension, diabetes, CKD who comes in with chest pain.  Patient reports having some chest pain yesterday and then not going away and then having some chest pain again that started today.  He states the pain started at rest and radiated down his left arm.  He reports having some associated fatigue.  Patient was given aspirin, nitro, 500 cc of fluid and now does reports having extreme fatigue but denies any chest pain.  Patient does report that he had a stress test done on Friday and he supposed to follow-up on Tuesday for the results.   I reviewed the record from 04/29/2022 where patient was seen by cardiology for increasing shortness of breath.  Physical Exam   Triage Vital Signs: Vitals:   05/17/22 1600 05/17/22 1700  BP: 127/60   Pulse: (!) 53 (!) 53  Resp: 19 17  Temp:    SpO2: 96% 96%     Most recent vital signs: Vitals:   05/17/22 1600 05/17/22 1700  BP: 127/60   Pulse: (!) 53 (!) 53  Resp: 19 17  Temp:    SpO2: 96% 96%     General: Awake, no distress.  CV:  Good peripheral perfusion.  Bradycardic Resp:  Normal effort.  Clear lungs Abd:  No distention.  Soft and nontender Other:  No swelling in the legs.  No calf tenderness   ED Results / Procedures / Treatments   Labs (all labs ordered are listed, but only abnormal results are displayed) Labs Reviewed  BASIC METABOLIC PANEL - Abnormal; Notable for the following components:      Result Value   Glucose, Bld 332 (*)    BUN 31 (*)    Creatinine, Ser 2.04 (*)    Calcium 8.6 (*)    GFR, Estimated 35 (*)    All other components within normal limits  CBC - Abnormal; Notable for the following components:   Hemoglobin 12.0 (*)    HCT 37.1 (*)    All other  components within normal limits  HEPATIC FUNCTION PANEL - Abnormal; Notable for the following components:   Total Protein 5.9 (*)    Albumin 3.3 (*)    Indirect Bilirubin 1.0 (*)    All other components within normal limits  RESP PANEL BY RT-PCR (FLU A&B, COVID) ARPGX2  BRAIN NATRIURETIC PEPTIDE  TSH  T4, FREE  URINALYSIS, ROUTINE W REFLEX MICROSCOPIC  TROPONIN I (HIGH SENSITIVITY)  TROPONIN I (HIGH SENSITIVITY)     EKG  My interpretation of EKG:  EKG is sinus rate of 59 without any ST elevation but does have a right bundle branch block    RADIOLOGY I have reviewed the xray personally and interpreted it and there is no evidence of any pneumonia  PROCEDURES:  Critical Care performed: No  .1-3 Lead EKG Interpretation Performed by: Vanessa Scooba, MD Authorized by: Vanessa McDermott, MD     Interpretation: abnormal     ECG rate:  55   ECG rate assessment: bradycardic     Rhythm: sinus bradycardia     Ectopy: none     Conduction: normal     MEDICATIONS ORDERED IN ED: Medications  isosorbide mononitrate (IMDUR) 24 hr tablet  30 mg (has no administration in time range)  sodium chloride 0.9 % bolus 500 mL (0 mLs Intravenous Stopped 05/17/22 1815)     IMPRESSION / MDM / ASSESSMENT AND PLAN / ED COURSE  I reviewed the triage vital signs and the nursing notes.   Patient seen for some chest pain.  This concerning for acute life-threatening pathology with differential including ACS.  He denies any significant shortness of breath no swelling the legs suggest PE.  He denies any symptoms at this time so unlikely dissection.   BMP shows elevated creatinine to 2.04 up from his prior glucose is elevated but no anion gap. COVID flu are negative BNP and thyroid are normal CBC shows stable hemoglobin at 12 Troponin 17  I was able to review patient's stress test from 5/16 cardiology note where appears normal  05/13/2022 10:13 AM EDT   Normal treadmill EKG without evidence of  ischemia or arrhythmia Normal myocardial perfusion without evidence of myocardial ischemia  Bob Miller    Reevaluated patient and wife is now at bedside and they are very concerned that over the past 2-3 month has had these episodes of generalized weakness and just having this overall feeling of fatigue and inability to ambulate with some occasional chest discomfort.  They deny any significant shortness of breath.  They report weight loss as well.  He has had a colonoscopy and endoscopy that have been normal.  We discussed doing CT imaging to evaluate but with contrast there is a risk to his kidneys.  They wanted to avoid any risk to potential kidneys as possible.  At this time he denies any symptoms so I do not think that it could be a PE but we will proceed with CTs without contrast to look for any mass/cancer that could be causing patient's severe fatigue and weakness   Patient's been ambulatory without any significant symptoms and oxygen level stayed above 97%.  We again discussed the possibility of pulmonary embolism but seems very unlikely given this been going on for months he is got no swelling in his legs, calf tenderness, and has not really shortness of breath that he feels but more just generalized weakness and overall fatigue for the past 2 or 3 months and we discussed the contrast given my lower suspicion he would like to hold off on contrast due to the risk to the kidneys.  6:54 PM repeat troponin went from 17 until 19.  Discussed case with Dr. Clayborn Bigness who recommends following up tomorrow or Tuesday with Dr. Nehemiah Massed for further discussion of the next steps given reassuring stress test.  He recommended giving an extra dose of Imdur now increasing his Imdur to 60 mg  I reviewed patient's heart rate when he was seen in the clinic and it was in the 50s at that time.  I cannot see prior EKGs but that was being read as junctional but is actually sinus.  I reviewed the cardiac monitor and  he is currently in sinus with a rate of 60.  Discussed with patient and he is feels comfortable with discharge home and reports resolution of symptoms and return to the ER if symptoms are worsening   The patient is on the cardiac monitor to evaluate for evidence of arrhythmia and/or significant heart rate changes.      FINAL CLINICAL IMPRESSION(S) / ED DIAGNOSES   Final diagnoses:  Chest pain, unspecified type     Rx / DC Orders   ED Discharge Orders  None        Note:  This document was prepared using Dragon voice recognition software and may include unintentional dictation errors.   Vanessa Osceola, MD 05/17/22 1911

## 2022-05-19 DIAGNOSIS — I6523 Occlusion and stenosis of bilateral carotid arteries: Secondary | ICD-10-CM | POA: Insufficient documentation

## 2022-06-08 ENCOUNTER — Ambulatory Visit: Payer: Medicare Other | Admitting: Urology

## 2022-06-08 ENCOUNTER — Encounter: Payer: Self-pay | Admitting: Urology

## 2022-06-11 ENCOUNTER — Encounter: Payer: Self-pay | Admitting: *Deleted

## 2022-06-11 ENCOUNTER — Encounter: Payer: Medicare Other | Attending: Endocrinology | Admitting: *Deleted

## 2022-06-11 VITALS — Wt 227.8 lb

## 2022-06-11 DIAGNOSIS — Z794 Long term (current) use of insulin: Secondary | ICD-10-CM | POA: Insufficient documentation

## 2022-06-11 DIAGNOSIS — E1165 Type 2 diabetes mellitus with hyperglycemia: Secondary | ICD-10-CM | POA: Diagnosis present

## 2022-06-11 NOTE — Progress Notes (Signed)
Appt. Start Time: 0900 Appt. End Time: 1130  Class 1 Diabetes Overview - define DM; state own type of DM; identify functions of pancreas and insulin; define insulin deficiency vs insulin resistance  Psychosocial - identify DM as a source of stress; state the effects of stress on BG control  Nutritional Management - describe effects of food on blood glucose; identify sources of carbohydrate, protein and fat; verbalize the importance of balance meals in controlling blood glucose  Exercise - describe the effects of exercise on blood glucose and importance of regular exercise in controlling diabetes; state a plan for personal exercise; verbalize contraindications for exercise  Self-Monitoring - state importance of SMBG; use SMBG results to effectively manage diabetes; identify importance of regular HbA1C testing and goals for results  Acute Complications - recognize hyperglycemia and hypoglycemia with causes and effects; identify blood glucose results as high, low or in control; list steps in treating and preventing high and low blood glucose  Chronic Complications/Foot, Skin, Eye Dental Care - identify possible long-term complications of diabetes (retinopathy, neuropathy, nephropathy, cardiovascular disease, infections); state importance of daily self-foot exams; describe how to examine feet and what to look for; explain appropriate eye and dental care  Lifestyle Changes/Goals & Health/Community Resources - state benefits of making appropriate lifestyle changes; identify habits that need to change (meals, tobacco, alcohol); identify strategies to reduce risk factors for personal health  Pregnancy/Sexual Health - define gestational diabetes; state importance of good blood glucose control and birth control prior to pregnancy; state importance of good blood glucose control in preventing sexual problems (impotence, vaginal dryness, infections, loss of desire); state relationship of blood glucose control  and pregnancy outcome; describe risk of maternal and fetal complications  Teaching Materials Used: Class 1 Slides/Notebook Diabetes Booklet ID Card  Medic Alert/Medic ID Forms Sleep Evaluation Exercise Handout Planning a Balanced Meal Goals for Class 1

## 2022-06-18 ENCOUNTER — Encounter: Payer: Medicare Other | Admitting: *Deleted

## 2022-06-18 ENCOUNTER — Encounter: Payer: Self-pay | Admitting: *Deleted

## 2022-06-18 VITALS — Wt 228.3 lb

## 2022-06-18 DIAGNOSIS — E1165 Type 2 diabetes mellitus with hyperglycemia: Secondary | ICD-10-CM | POA: Diagnosis not present

## 2022-06-18 NOTE — Progress Notes (Signed)

## 2022-06-25 ENCOUNTER — Encounter: Payer: Self-pay | Admitting: *Deleted

## 2022-06-25 ENCOUNTER — Encounter: Payer: Medicare Other | Admitting: *Deleted

## 2022-06-25 VITALS — BP 118/62 | Wt 225.5 lb

## 2022-06-25 DIAGNOSIS — Z794 Long term (current) use of insulin: Secondary | ICD-10-CM

## 2022-06-25 DIAGNOSIS — E1165 Type 2 diabetes mellitus with hyperglycemia: Secondary | ICD-10-CM | POA: Diagnosis not present

## 2022-06-25 NOTE — Progress Notes (Signed)

## 2022-10-12 ENCOUNTER — Other Ambulatory Visit: Payer: Self-pay | Admitting: Urology

## 2022-10-30 ENCOUNTER — Observation Stay
Admission: EM | Admit: 2022-10-30 | Discharge: 2022-10-31 | Disposition: A | Discharge status: Home / Self Care | Payer: Medicare Other | Attending: Internal Medicine | Admitting: Internal Medicine

## 2022-10-30 ENCOUNTER — Emergency Department: Payer: Medicare Other

## 2022-10-30 ENCOUNTER — Other Ambulatory Visit: Payer: Self-pay

## 2022-10-30 DIAGNOSIS — I129 Hypertensive chronic kidney disease with stage 1 through stage 4 chronic kidney disease, or unspecified chronic kidney disease: Secondary | ICD-10-CM | POA: Diagnosis not present

## 2022-10-30 DIAGNOSIS — Z7982 Long term (current) use of aspirin: Secondary | ICD-10-CM | POA: Diagnosis not present

## 2022-10-30 DIAGNOSIS — I25810 Atherosclerosis of coronary artery bypass graft(s) without angina pectoris: Secondary | ICD-10-CM | POA: Diagnosis not present

## 2022-10-30 DIAGNOSIS — N1832 Chronic kidney disease, stage 3b: Secondary | ICD-10-CM | POA: Insufficient documentation

## 2022-10-30 DIAGNOSIS — I639 Cerebral infarction, unspecified: Principal | ICD-10-CM | POA: Insufficient documentation

## 2022-10-30 DIAGNOSIS — Z1152 Encounter for screening for COVID-19: Secondary | ICD-10-CM | POA: Diagnosis not present

## 2022-10-30 DIAGNOSIS — I152 Hypertension secondary to endocrine disorders: Secondary | ICD-10-CM | POA: Diagnosis present

## 2022-10-30 DIAGNOSIS — Z79899 Other long term (current) drug therapy: Secondary | ICD-10-CM | POA: Diagnosis not present

## 2022-10-30 DIAGNOSIS — E1159 Type 2 diabetes mellitus with other circulatory complications: Secondary | ICD-10-CM | POA: Diagnosis present

## 2022-10-30 DIAGNOSIS — Z789 Other specified health status: Secondary | ICD-10-CM | POA: Insufficient documentation

## 2022-10-30 DIAGNOSIS — I6523 Occlusion and stenosis of bilateral carotid arteries: Secondary | ICD-10-CM

## 2022-10-30 DIAGNOSIS — R471 Dysarthria and anarthria: Principal | ICD-10-CM

## 2022-10-30 DIAGNOSIS — Z7984 Long term (current) use of oral hypoglycemic drugs: Secondary | ICD-10-CM | POA: Insufficient documentation

## 2022-10-30 DIAGNOSIS — Z7902 Long term (current) use of antithrombotics/antiplatelets: Secondary | ICD-10-CM | POA: Insufficient documentation

## 2022-10-30 DIAGNOSIS — Z23 Encounter for immunization: Secondary | ICD-10-CM | POA: Insufficient documentation

## 2022-10-30 DIAGNOSIS — R29818 Other symptoms and signs involving the nervous system: Secondary | ICD-10-CM | POA: Diagnosis not present

## 2022-10-30 DIAGNOSIS — E1122 Type 2 diabetes mellitus with diabetic chronic kidney disease: Secondary | ICD-10-CM | POA: Insufficient documentation

## 2022-10-30 DIAGNOSIS — Z951 Presence of aortocoronary bypass graft: Secondary | ICD-10-CM | POA: Diagnosis not present

## 2022-10-30 DIAGNOSIS — I2510 Atherosclerotic heart disease of native coronary artery without angina pectoris: Secondary | ICD-10-CM | POA: Diagnosis not present

## 2022-10-30 DIAGNOSIS — Z794 Long term (current) use of insulin: Secondary | ICD-10-CM | POA: Insufficient documentation

## 2022-10-30 DIAGNOSIS — R4701 Aphasia: Secondary | ICD-10-CM | POA: Diagnosis present

## 2022-10-30 LAB — DIFFERENTIAL
Abs Immature Granulocytes: 0.05 10*3/uL (ref 0.00–0.07)
Basophils Absolute: 0.1 10*3/uL (ref 0.0–0.1)
Basophils Relative: 1 %
Eosinophils Absolute: 0.3 10*3/uL (ref 0.0–0.5)
Eosinophils Relative: 4 %
Immature Granulocytes: 1 %
Lymphocytes Relative: 37 %
Lymphs Abs: 3.4 10*3/uL (ref 0.7–4.0)
Monocytes Absolute: 0.8 10*3/uL (ref 0.1–1.0)
Monocytes Relative: 9 %
Neutro Abs: 4.7 10*3/uL (ref 1.7–7.7)
Neutrophils Relative %: 48 %

## 2022-10-30 LAB — COMPREHENSIVE METABOLIC PANEL
ALT: 25 U/L (ref 0–44)
AST: 21 U/L (ref 15–41)
Albumin: 3.7 g/dL (ref 3.5–5.0)
Alkaline Phosphatase: 60 U/L (ref 38–126)
Anion gap: 8 (ref 5–15)
BUN: 21 mg/dL (ref 8–23)
CO2: 25 mmol/L (ref 22–32)
Calcium: 9 mg/dL (ref 8.9–10.3)
Chloride: 104 mmol/L (ref 98–111)
Creatinine, Ser: 1.52 mg/dL — ABNORMAL HIGH (ref 0.61–1.24)
GFR, Estimated: 49 mL/min — ABNORMAL LOW (ref >60)
Glucose, Bld: 271 mg/dL — ABNORMAL HIGH (ref 70–99)
Potassium: 3.9 mmol/L (ref 3.5–5.1)
Sodium: 137 mmol/L (ref 135–145)
Total Bilirubin: 1.2 mg/dL (ref 0.3–1.2)
Total Protein: 6.5 g/dL (ref 6.5–8.1)

## 2022-10-30 LAB — CBC
HCT: 40.7 % (ref 39.0–52.0)
Hemoglobin: 13.7 g/dL (ref 13.0–17.0)
MCH: 28.1 pg (ref 26.0–34.0)
MCHC: 33.7 g/dL (ref 30.0–36.0)
MCV: 83.6 fL (ref 80.0–100.0)
Platelets: 170 10*3/uL (ref 150–400)
RBC: 4.87 MIL/uL (ref 4.22–5.81)
RDW: 14 % (ref 11.5–15.5)
WBC: 9.3 10*3/uL (ref 4.0–10.5)
nRBC: 0 % (ref 0.0–0.2)

## 2022-10-30 LAB — URINE DRUG SCREEN, QUALITATIVE (ARMC ONLY)
Amphetamines, Ur Screen: NOT DETECTED
Barbiturates, Ur Screen: NOT DETECTED
Benzodiazepine, Ur Scrn: NOT DETECTED
Cannabinoid 50 Ng, Ur ~~LOC~~: NOT DETECTED
Cocaine Metabolite,Ur ~~LOC~~: NOT DETECTED
MDMA (Ecstasy)Ur Screen: NOT DETECTED
Methadone Scn, Ur: NOT DETECTED
Opiate, Ur Screen: NOT DETECTED
Phencyclidine (PCP) Ur S: NOT DETECTED
Tricyclic, Ur Screen: NOT DETECTED

## 2022-10-30 LAB — PROTIME-INR
INR: 1 (ref 0.8–1.2)
Prothrombin Time: 13.1 seconds (ref 11.4–15.2)

## 2022-10-30 LAB — RESP PANEL BY RT-PCR (FLU A&B, COVID) ARPGX2
Influenza A by PCR: NEGATIVE
Influenza B by PCR: NEGATIVE
SARS Coronavirus 2 by RT PCR: NEGATIVE

## 2022-10-30 LAB — ETHANOL: Alcohol, Ethyl (B): <10 mg/dL (ref <10)

## 2022-10-30 LAB — APTT: aPTT: 22 seconds — ABNORMAL LOW (ref 24–36)

## 2022-10-30 LAB — I-STAT CREATININE, ED: Creatinine, Ser: 1.6 mg/dL — ABNORMAL HIGH (ref 0.61–1.24)

## 2022-10-30 LAB — GLUCOSE, CAPILLARY: Glucose-Capillary: 297 mg/dL — ABNORMAL HIGH (ref 70–99)

## 2022-10-30 MED ORDER — PANTOPRAZOLE SODIUM 40 MG PO TBEC
40.0000 mg | DELAYED_RELEASE_TABLET | Freq: Every day | ORAL | Status: DC
  Administered 2022-10-31: 40 mg via ORAL
  Filled 2022-10-30: qty 1

## 2022-10-30 MED ORDER — AMLODIPINE BESYLATE 10 MG PO TABS
10.0000 mg | ORAL_TABLET | Freq: Once | ORAL | Status: AC
  Administered 2022-10-30: 10 mg via ORAL
  Filled 2022-10-30: qty 1

## 2022-10-30 MED ORDER — CLOPIDOGREL BISULFATE 75 MG PO TABS
75.0000 mg | ORAL_TABLET | Freq: Every day | ORAL | Status: DC
  Administered 2022-10-31: 75 mg via ORAL
  Filled 2022-10-30: qty 1

## 2022-10-30 MED ORDER — ASPIRIN 81 MG PO TBEC
81.0000 mg | DELAYED_RELEASE_TABLET | Freq: Every day | ORAL | Status: DC
  Administered 2022-10-30 – 2022-10-31 (×2): 81 mg via ORAL
  Filled 2022-10-30 (×2): qty 1

## 2022-10-30 MED ORDER — ACETAMINOPHEN 160 MG/5ML PO SOLN: 650.0000 mg | ORAL | Status: DC | PRN

## 2022-10-30 MED ORDER — ENOXAPARIN SODIUM 60 MG/0.6ML IJ SOSY
0.5000 mg/kg | PREFILLED_SYRINGE | INTRAMUSCULAR | Status: DC
  Administered 2022-10-30: 52.5 mg via SUBCUTANEOUS
  Filled 2022-10-30: qty 0.6

## 2022-10-30 MED ORDER — ACETAMINOPHEN 650 MG RE SUPP: 650.0000 mg | RECTAL | Status: DC | PRN

## 2022-10-30 MED ORDER — TAMSULOSIN HCL 0.4 MG PO CAPS
0.4000 mg | ORAL_CAPSULE | Freq: Every day | ORAL | Status: DC
  Administered 2022-10-31: 0.4 mg via ORAL
  Filled 2022-10-30: qty 1

## 2022-10-30 MED ORDER — GABAPENTIN 100 MG PO CAPS
100.0000 mg | ORAL_CAPSULE | Freq: Two times a day (BID) | ORAL | Status: DC
  Administered 2022-10-30 – 2022-10-31 (×2): 100 mg via ORAL
  Filled 2022-10-30 (×2): qty 1

## 2022-10-30 MED ORDER — DOXAZOSIN MESYLATE 2 MG PO TABS
2.0000 mg | ORAL_TABLET | Freq: Every day | ORAL | Status: DC
  Administered 2022-10-31: 2 mg via ORAL
  Filled 2022-10-30: qty 1

## 2022-10-30 MED ORDER — METOPROLOL TARTRATE 50 MG PO TABS
50.0000 mg | ORAL_TABLET | Freq: Two times a day (BID) | ORAL | Status: DC
  Administered 2022-10-30 – 2022-10-31 (×2): 50 mg via ORAL
  Filled 2022-10-30 (×2): qty 1

## 2022-10-30 MED ORDER — STROKE: EARLY STAGES OF RECOVERY BOOK: Freq: Once | Status: AC

## 2022-10-30 MED ORDER — ACETAMINOPHEN 325 MG PO TABS: 650.0000 mg | ORAL_TABLET | ORAL | Status: DC | PRN

## 2022-10-30 MED ORDER — AMLODIPINE BESYLATE 5 MG PO TABS: 10.0000 mg | ORAL_TABLET | Freq: Every day | ORAL | Status: DC

## 2022-10-30 MED ORDER — INFLUENZA VAC A&B SA ADJ QUAD 0.5 ML IM PRSY
0.5000 mL | PREFILLED_SYRINGE | INTRAMUSCULAR | Status: AC
  Administered 2022-10-31: 0.5 mL via INTRAMUSCULAR
  Filled 2022-10-30: qty 0.5

## 2022-10-30 MED ORDER — INSULIN ASPART 100 UNIT/ML IJ SOLN
0.0000 [IU] | Freq: Three times a day (TID) | INTRAMUSCULAR | Status: DC
  Administered 2022-10-31: 11 [IU] via SUBCUTANEOUS
  Administered 2022-10-31: 20 [IU] via SUBCUTANEOUS
  Filled 2022-10-30 (×2): qty 1

## 2022-10-30 MED ORDER — IOHEXOL 350 MG/ML SOLN
75.0000 mL | Freq: Once | INTRAVENOUS | Status: AC | PRN
  Administered 2022-10-30: 75 mL via INTRAVENOUS

## 2022-10-30 MED ORDER — ROSUVASTATIN CALCIUM 20 MG PO TABS
20.0000 mg | ORAL_TABLET | Freq: Every day | ORAL | Status: DC
  Administered 2022-10-31: 20 mg via ORAL
  Filled 2022-10-30: qty 1

## 2022-10-30 MED ORDER — LOSARTAN POTASSIUM 50 MG PO TABS
100.0000 mg | ORAL_TABLET | Freq: Every day | ORAL | Status: DC
  Administered 2022-10-31: 100 mg via ORAL
  Filled 2022-10-30: qty 2

## 2022-10-30 MED ORDER — INSULIN GLARGINE-YFGN 100 UNIT/ML ~~LOC~~ SOLN
20.0000 [IU] | Freq: Every day | SUBCUTANEOUS | Status: DC
  Administered 2022-10-30: 20 [IU] via SUBCUTANEOUS
  Filled 2022-10-30: qty 0.2

## 2022-10-30 MED ORDER — INSULIN ASPART 100 UNIT/ML IJ SOLN
0.0000 [IU] | Freq: Every day | INTRAMUSCULAR | Status: DC
  Administered 2022-10-30: 3 [IU] via SUBCUTANEOUS
  Filled 2022-10-30: qty 1

## 2022-10-30 MED ORDER — SODIUM CHLORIDE 0.9% FLUSH
3.0000 mL | Freq: Once | INTRAVENOUS | Status: AC
  Administered 2022-10-30: 3 mL via INTRAVENOUS

## 2022-10-30 MED ORDER — ISOSORBIDE MONONITRATE ER 60 MG PO TB24
60.0000 mg | ORAL_TABLET | Freq: Every day | ORAL | Status: DC
  Administered 2022-10-31: 60 mg via ORAL
  Filled 2022-10-30: qty 1

## 2022-10-30 NOTE — Progress Notes (Signed)
  Chaplain On-Call responded to Code Stroke notification at 1833 hours for ED room 14.  The patient has been transported to the CT Scan area for evaluation.  No family is present.  Chaplain is available for additional support as needed.  Chaplain Pollyann Samples M.Div., [facility]

## 2022-10-30 NOTE — ED Notes (Signed)
Neurologist at bedside and consents to pt eating. Meal tray provided

## 2022-10-30 NOTE — ED Notes (Signed)
CTA complete, Dr. Leonel Ramsay present via tele monitor
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
## 2022-10-30 NOTE — ED Triage Notes (Addendum)
Pt from home via ems reports that he laid down for a nap at 1500 and was normal, woke at 1700 and noticed his speech was slurred. Denies weakness.

## 2022-10-30 NOTE — ED Notes (Signed)
CBG 230

## 2022-10-30 NOTE — ED Notes (Signed)
Pt to CT with this RN.

## 2022-10-30 NOTE — Consult Note (Signed)
[facility] Telemedicine Consult   Requesting Provider: Nigel Bridgeman Consult Participants: Patient, Nurse   This consult was provided via telemedicine with 2-way video and audio communication. The patient/family was informed that care would be provided in this way and agreed to receive care in this manner.    Chief Complaint: Slurred Speech  HPI: 71 yo M With a history of diabetes, hypertension, hyperlipidemia who presents for slurred speech that was present on awakening from.  He laid down around 3 PM, he had slow speech that was slurred.  Due to the symptoms he presented to the emergency department where history of stroke was activated.    LKW: 3 PM tpa given?: No, no symptoms   IR Thrombectomy? No, mild symptoms Modified Rankin Scale: 0-Completely asymptomatic and back to baseline post- stroke Time of teleneurologist evaluation: 18:36  Exam: Vitals:   10/30/22 1833  BP: (!) 162/67  Pulse: 66  Resp: 18  Temp: (!) 96.9 F (36.1 C)  SpO2: 99%    General: in CT, NAD  1A: Level of Consciousness - 0 1B: Ask Month and Age - 0 1C: 'Blink Eyes' & 'Squeeze Hands' - 0 2: Test Horizontal Extraocular Movements - 0 3: Test Visual Fields - 0 4: Test Facial Palsy - 0 5A: Test Left Arm Motor Drift - 0 5B: Test Right Arm Motor Drift - 0 6A: Test Left Leg Motor Drift - 0 6B: Test Right Leg Motor Drift - 0 7: Test Limb Ataxia - 0 8: Test Sensation - 0 9: Test Language/Aphasia- 0 10: Test Dysarthria - 1 11: Test Extinction/Inattention - 0 NIHSS score: 1   Imaging Reviewed: CT head - negative  Labs reviewed in epic and pertinent values follow: Cr 1.6 CBC unremarkable   Assessment: 71 yo M with slurred speech of unclear etiology. His symptoms are mild, and this far out from [place], I feel that risk outweighs the expected benefits form IV TNK. I would perform an MRI, but if it is negative, do not feel strongly about this representing TIA. I feel that isolated  dysarthria is a fairly non-specific symptom and unless there is an imaging finding to support this being cerebrovascular in origin(e.g. an acute stroke on MRI) or if family gives other history once they arrive, I would not favor calling this cerebral ischemia.   Recommendations:  1) CTA head and neck 2) MRI brain  3) ETOH, UDS 4) Further recommendations following the above imaging.    [name], MD [facility] [phone]  If 7pm- 7am, please page neurology on call as listed in [place].

## 2022-10-30 NOTE — Assessment & Plan Note (Addendum)
DM2-reduce home Basaglar from 30 units to 20 units, add high-dose sliding scale insulin correction factor.  Hold home metformin BPH-continue doxazosin, tamsulosin Hypertension/CAD-continue amlodipine, Imdur, losartan, Lopressor, aspirin, Plavix Hyperlipidemia-continue rosuvastatin Neuropathy-continue gabapentin

## 2022-10-30 NOTE — Progress Notes (Signed)
Neurologist, Dr Kathrynn Speed, joins the telemedicine cart at [phone]. Cart taken to CT scan.

## 2022-10-30 NOTE — Progress Notes (Signed)
Stroke alert activation at 1830.

## 2022-10-30 NOTE — H&P (Signed)
History and Physical    Patient: Bob Miller GXQ:[id number] DOB: 1951-04-17 DOA: 10/30/2022 DOS: the patient was seen and examined on 10/30/2022 PCP: Iva Boop, MD  Patient coming from: Home  Chief Complaint:  Chief Complaint  Patient presents with   Aphasia   HPI: Bob Miller is a 71 y.o. male with medical history significant for MI status post CABG, type 2 diabetes, hypertension, CKD, who presented with difficulty speaking.  History obtained from patient and his wife. Patient reports he went to take a nap around 2:30 PM.  His wife tried to wake him up around 430 but had difficulty.  Is finally able to get him up around 5:10 PM, he reports that when he woke up he was slurring his words as if he was drunk.  Also felt like he could not properly see in the top half of his vision of his if it was cut off.  Shortly after he called an ambulance, they departed his house around 6:10 PM at which point his symptoms started to abate.  During my interview he reported that he felt about 99% better.  He denies missing any of his occasions recently.  In the ED initial vital signs notable only for mild hypertension.  CBC was unremarkable, CMP showed creatinine at baseline (1.5), and hyperglycemia with glucose of 271.  Urine drug screen and serum alcohol level were both normal.  Respiratory viral testing was negative.  Neurology was consulted.  CT head and CT angio head and neck were obtained as part of a code stroke, there was severe stenosis in the right vertebral artery at the V3/V4 junction as well as mild to moderate stenoses in bilateral internal carotids, however no obvious acute lesions.  MRI was ordered but after consultation with radiologist the MRI tech reported that MRI head was not able to be performed due to the presence of metal fragments in the patient's chest.  He was admitted for further management and work-up.   Review of Systems: As mentioned in the history of present illness.  All other systems reviewed and are negative. Past Medical History:  Diagnosis Date   Chronic kidney disease    stage 2   Colon polyps    Coronary artery disease    Diabetes mellitus without complication ([place])    Hyperlipidemia    Hypertension    Obesity    Restless leg syndrome    Past Surgical History:  Procedure Laterality Date   CATARACT EXTRACTION W/PHACO Right 07/17/2020   Procedure: CATARACT EXTRACTION PHACO AND INTRAOCULAR LENS PLACEMENT ([place]) RIGHT DIABETIC;  Surgeon: Leandrew Koyanagi, MD;  Location: [facility];  Service: Ophthalmology;  Laterality: Right;  6.45 1:30.6 7.1%   CATARACT EXTRACTION W/PHACO Left 09/04/2020   Procedure: CATARACT EXTRACTION PHACO AND INTRAOCULAR LENS PLACEMENT ([facility]) LEFT DIABETIC 8.07  00:55.2  14.6%;  Surgeon: Leandrew Koyanagi, MD;  Location: [facility];  Service: Ophthalmology;  Laterality: Left;  Diabetic - insulin and oral meds   COLONOSCOPY     COLONOSCOPY WITH PROPOFOL N/A 05/05/2021   Procedure: COLONOSCOPY WITH PROPOFOL;  Surgeon: Lesly Rubenstein, MD;  Location: [facility];  Service: Endoscopy;  Laterality: N/A;   CORONARY ARTERY BYPASS GRAFT     ESOPHAGOGASTRODUODENOSCOPY (EGD) WITH PROPOFOL N/A 05/05/2021   Procedure: ESOPHAGOGASTRODUODENOSCOPY (E[facility]) WITH PROPOFOL;  Surgeon: Lesly Rubenstein, MD;  Location: [facility];  Service: Endoscopy;  Laterality: N/A;  DM   EYE SURGERY     pci with stents  TONSILLECTOMY     Social History:  reports that he has never smoked. His smokeless tobacco use includes snuff. He reports that he does not currently use alcohol. He reports that he does not use drugs.  Allergies  Allergen Reactions   Codeine     anxiety   Crestor [Rosuvastatin]     Muscle pain   Lipitor [Atorvastatin]     Muscle pain   Tramadol     Family History  Problem Relation Age of Onset   Diabetes Paternal Aunt     Prior to Admission medications   Medication Sig Start Date End Date  Taking? Authorizing Provider  metFORMIN (GLUCOPHAGE-XR) 500 MG 24 hr tablet Take 1,000 mg by mouth daily. 09/11/22  Yes [provider]  rosuvastatin (CRESTOR) 20 MG tablet Take 20 mg by mouth daily. 03/07/18  Yes [provider]  amLODipine (NORVASC) 10 MG tablet Take 10 mg by mouth daily.    [provider]  aspirin EC 81 MG tablet Take 81 mg by mouth daily.    [provider]  clopidogrel (PLAVIX) 75 MG tablet Take 75 mg by mouth daily.    [provider]  cyanocobalamin 1000 MCG tablet Take 1 tablet by mouth daily.    [provider]  doxazosin (CARDURA) 2 MG tablet Take 2 mg by mouth daily.     [provider]  gabapentin (NEURONTIN) 100 MG capsule Take 100 mg by mouth 2 (two) times daily. 04/07/22   [provider]  glucosamine-chondroitin 500-400 MG tablet Take 1 tablet by mouth daily.    [provider]  Insulin Glargine (BASAGLAR KWIKPEN) 100 UNIT/ML Inject 30 Units into the skin at bedtime. 04/07/22   [provider]  isosorbide mononitrate (IMDUR) 60 MG 24 hr tablet Take 60 mg by mouth daily. 04/19/22   [provider]  losartan (COZAAR) 100 MG tablet Take 100 mg by mouth daily. 04/24/22   [provider]  metoprolol tartrate (LOPRESSOR) 100 MG tablet Take 50 mg by mouth 2 (two) times daily.    [provider]  nitroGLYCERIN (NITROSTAT) 0.4 MG SL tablet Place 0.4 mg under the tongue every 5 (five) minutes as needed for chest pain. Patient not taking: Reported on 05/13/2022    [provider]  pantoprazole (PROTONIX) 40 MG tablet Take 40 mg by mouth daily.    [provider]  tamsulosin (FLOMAX) 0.4 MG CAPS capsule Take 1 capsule (0.4 mg total) by mouth daily. 12/08/21   Stoioff, Ronda Fairly, MD  Vitamin D, Ergocalciferol, (DRISDOL) 1.25 MG (50000 UNIT) CAPS capsule Take 50,000 Units by mouth once a week. 04/05/22   [provider]    Physical  Exam: Vitals:   10/30/22 1833 10/30/22 1927 10/30/22 2054 10/30/22 2100  BP: (!) 162/67 (!) 150/66 (!) 149/64 (!) 163/66  Pulse: 66 (!) 54 (!) 55 (!) 55  Resp: 18 19 (!) 22 19  Temp: (!) 96.9 F (36.1 C) (!) 97.4 F (36.3 C)  (!) 97.5 F (36.4 C)  TempSrc: Axillary Oral  Oral  SpO2: 99% 97% 99% 96%   Physical Exam Constitutional:      General: He is not in acute distress.    Appearance: Normal appearance. He is obese. He is not ill-appearing, toxic-appearing or diaphoretic.  HENT:     Head: Normocephalic and atraumatic.     Mouth/Throat:     Mouth: Mucous membranes are moist.  Eyes:     General: No scleral icterus.  Extraocular Movements: Extraocular movements intact.  Cardiovascular:     Rate and Rhythm: Normal rate and regular rhythm.     Heart sounds: Normal heart sounds. No murmur heard. Pulmonary:     Effort: Pulmonary effort is normal. No respiratory distress.     Breath sounds: Normal breath sounds. No wheezing, rhonchi or rales.  Abdominal:     General: Abdomen is flat. Bowel sounds are normal.     Palpations: Abdomen is soft. There is no mass.     Tenderness: There is no abdominal tenderness.  Musculoskeletal:     Right lower leg: No edema.     Left lower leg: No edema.  Skin:    General: Skin is warm and dry.     Coloration: Skin is not jaundiced.  Neurological:     General: No focal deficit present.     Mental Status: He is alert.     Comments: CN intact. Strength 5/5 in bilateral upper extremities. Neg pronator drift.       Data Reviewed:  Results for orders placed or performed during the hospital encounter of 10/30/22 (from the past 24 hour(s))  Protime-INR     Status: None   Collection Time: 10/30/22  6:42 PM  Result Value Ref Range   Prothrombin Time 13.1 11.4 - 15.2 seconds   INR 1.0 0.8 - 1.2  APTT     Status: Abnormal   Collection Time: 10/30/22  6:42 PM  Result Value Ref Range   aPTT 22 (L) 24 - 36 seconds  CBC     Status: None    Collection Time: 10/30/22  6:42 PM  Result Value Ref Range   WBC 9.3 4.0 - 10.5 K/uL   RBC 4.87 4.22 - 5.81 MIL/uL   Hemoglobin 13.7 13.0 - 17.0 g/dL   HCT 40.7 39.0 - 52.0 %   MCV 83.6 80.0 - 100.0 fL   MCH 28.1 26.0 - 34.0 pg   MCHC 33.7 30.0 - 36.0 g/dL   RDW 14.0 11.5 - 15.5 %   Platelets 170 150 - 400 K/uL   nRBC 0.0 0.0 - 0.2 %  Differential     Status: None   Collection Time: 10/30/22  6:42 PM  Result Value Ref Range   Neutrophils Relative % 48 %   Neutro Abs 4.7 1.7 - 7.7 K/uL   Lymphocytes Relative 37 %   Lymphs Abs 3.4 0.7 - 4.0 K/uL   Monocytes Relative 9 %   Monocytes Absolute 0.8 0.1 - 1.0 K/uL   Eosinophils Relative 4 %   Eosinophils Absolute 0.3 0.0 - 0.5 K/uL   Basophils Relative 1 %   Basophils Absolute 0.1 0.0 - 0.1 K/uL   Immature Granulocytes 1 %   Abs Immature Granulocytes 0.05 0.00 - 0.07 K/uL  Comprehensive metabolic panel     Status: Abnormal   Collection Time: 10/30/22  6:42 PM  Result Value Ref Range   Sodium 137 135 - 145 mmol/L   Potassium 3.9 3.5 - 5.1 mmol/L   Chloride 104 98 - 111 mmol/L   CO2 25 22 - 32 mmol/L   Glucose, Bld 271 (H) 70 - 99 mg/dL   BUN 21 8 - 23 mg/dL   Creatinine, Ser 1.52 (H) 0.61 - 1.24 mg/dL   Calcium 9.0 8.9 - 10.3 mg/dL   Total Protein 6.5 6.5 - 8.1 g/dL   Albumin 3.7 3.5 - 5.0 g/dL   AST 21 15 - 41 U/L   ALT 25 0 -  44 U/L   Alkaline Phosphatase 60 38 - 126 U/L   Total Bilirubin 1.2 0.3 - 1.2 mg/dL   GFR, Estimated 49 (L) >60 mL/min   Anion gap 8 5 - 15  Ethanol     Status: None   Collection Time: 10/30/22  6:42 PM  Result Value Ref Range   Alcohol, Ethyl (B) <10 <10 mg/dL  I-stat Creatinine, ED     Status: Abnormal   Collection Time: 10/30/22  6:50 PM  Result Value Ref Range   Creatinine, Ser 1.60 (H) 0.61 - 1.24 mg/dL  Urine Drug Screen, Qualitative ([facility] only)     Status: None   Collection Time: 10/30/22  7:25 PM  Result Value Ref Range   Tricyclic, Ur Screen NONE DETECTED NONE DETECTED   Amphetamines,  Ur Screen NONE DETECTED NONE DETECTED   MDMA (Ecstasy)Ur Screen NONE DETECTED NONE DETECTED   Cocaine Metabolite,Ur [place] NONE DETECTED NONE DETECTED   Opiate, Ur Screen NONE DETECTED NONE DETECTED   Phencyclidine (PCP) Ur S NONE DETECTED NONE DETECTED   Cannabinoid 50 Ng, Ur [place] NONE DETECTED NONE DETECTED   Barbiturates, Ur Screen NONE DETECTED NONE DETECTED   Benzodiazepine, Ur Scrn NONE DETECTED NONE DETECTED   Methadone Scn, Ur NONE DETECTED NONE DETECTED  Resp Panel by RT-PCR (Flu A&B, Covid) Anterior Nasal Swab     Status: None   Collection Time: 10/30/22  7:39 PM   Specimen: Anterior Nasal Swab  Result Value Ref Range   SARS Coronavirus 2 by RT PCR NEGATIVE NEGATIVE   Influenza A by PCR NEGATIVE NEGATIVE   Influenza B by PCR NEGATIVE NEGATIVE    DG Chest Portable 1 View CLINICAL DATA:  Altered mental status, code stroke, slurred speech  EXAM: PORTABLE CHEST 1 VIEW  COMPARISON:  05/17/2022  FINDINGS: Lungs are clear.  No pleural effusion or pneumothorax.  The heart is normal in size. Postsurgical changes related to prior CABG.  Median sternotomy.  IMPRESSION: No evidence of acute cardiopulmonary disease.  Electronically Signed   By: Julian Hy M.D.   On: 10/30/2022 20:53 CT ANGIO HEAD NECK W WO CM (CODE STROKE) CLINICAL DATA:  Provided history: Neuro deficit, acute, stroke suspected. Additional history provided: Slurred speech, last known well 1500  EXAM: CT ANGIOGRAPHY HEAD AND NECK  TECHNIQUE: Multidetector CT imaging of the head and neck was performed using the standard protocol during bolus administration of intravenous contrast. Multiplanar CT image reconstructions and MIPs were obtained to evaluate the vascular anatomy. Carotid stenosis measurements (when applicable) are obtained utilizing NASCET criteria, using the distal internal carotid diameter as the denominator.  RADIATION DOSE REDUCTION: This exam was performed according to  the departmental dose-optimization program which includes automated exposure control, adjustment of the mA and/or kV according to patient size and/or use of iterative reconstruction technique.  CONTRAST:  25m OMNIPAQUE IOHEXOL 350 MG/ML SOLN  COMPARISON:  Noncontrast head CT performed earlier today 10/30/2022.  FINDINGS: CTA NECK FINDINGS  Aortic arch: Common origin of the innominate and left common carotid arteries. Atherosclerotic plaque within the visualized aortic arch and proximal major branch vessels of the neck. No hemodynamically significant innominate or proximal subclavian artery stenosis.  Right carotid system: CCA and ICA patent within the neck. Atherosclerotic plaque scattered throughout the CCA, about the carotid bifurcation, and within the cervical ICA. Calcified atherosclerotic plaque about the carotid bifurcation results in less than 50% stenosis at the ICA origin. However, calcified plaque more distally within the proximal ICA results in estimated 70-80%  stenosis (series 7, image 206).  Left carotid system: CCA and ICA patent within the neck. Atherosclerotic plaque within the CCA, about the carotid bifurcation and within the cervical ICA. Most notably, calcified atherosclerotic plaque within the proximal cervical ICA results in estimated 70% stenosis.  Vertebral arteries: The right vertebral artery is patent within the neck. Atherosclerotic plaque scattered throughout this vessel. Most notably, atherosclerotic plaque at the origin of the right vertebral artery results in apparent severe stenosis, and there is a severe atherosclerotic stenosis within the right vertebral artery at the V3/V4 junction. The left vertebral artery is patent within the neck atherosclerotic plaque scattered throughout this vessel. Most notably, there are sites of up to 50% stenosis within the P2 segment.  Skeleton: Spondylosis at the cervical and visualized upper thoracic levels.  Facet joint ankylosis on the right at C4-C5. No acute fracture or aggressive osseous lesion.  Other neck: No neck mass or cervical lymphadenopathy.  Upper chest: No consolidation within the imaged lung apices. Prior median sternotomy.  Review of the MIP images confirms the above findings  CTA HEAD FINDINGS  Anterior circulation:  The intracranial internal carotid arteries are patent. Atherosclerotic plaque within both vessels. Mild to moderate stenosis within the cavernous segments, bilaterally. The M1 middle cerebral arteries are patent. Atherosclerotic irregularity of the M2 and more distal MCA vessels, bilaterally. No M2 proximal branch occlusion or high-grade proximal stenosis is identified. The anterior cerebral arteries are patent. No intracranial aneurysm is identified.  Posterior circulation:  The intracranial vertebral arteries are patent. Severe stenosis within the right vertebral artery at the V3/V4 junction. Calcified atherosclerotic plaque within the proximal V4 left vertebral artery with mild-to-moderate stenosis. The basilar artery is patent. The posterior cerebral arteries are patent. Posterior communicating arteries are diminutive or absent, bilaterally.  Venous sinuses: Within the limitations of contrast timing, no convincing thrombus.  Anatomic variants: As described  Review of the MIP images confirms the above findings  These results were communicated to Dr. Leonel Ramsay at 7:39 pmon 11/3/2023by text page via the [facility] messaging system.  IMPRESSION: CTA neck:  1. The common carotid and internal carotid arteries are patent within the neck. Atherosclerotic plaque, bilaterally. Most notably, there is an estimated 70-80% stenosis within the proximal right ICA and estimated 70% stenosis within the proximal left ICA. 2. Vertebral arteries patent within the neck. Atherosclerotic plaque, bilaterally. Most notably, there are sites of severe atherosclerotic  narrowing within the right vertebral artery at its origin and at the V3/V4 junction. Sites of up to 50% stenosis within the left V2 segment. 3.  Aortic Atherosclerosis (ICD10-I70.0).  CTA head:  1. No intracranial large vessel occlusion is identified. 2. Intracranial atherosclerotic disease with multifocal stenoses, most notably as follows. 3. Severe stenosis within the right vertebral artery at the V3/V4 junction. 4. Mild-to-moderate stenoses within the bilateral cavernous internal carotid arteries, and within the left vertebral artery V4 segment.  Electronically Signed   By: Kellie Simmering D.O.   On: 10/30/2022 19:40 CT HEAD CODE STROKE WO CONTRAST CLINICAL DATA:  Code stroke. Provided history: Neuro deficit, acute, stroke suspected. Additional history provided: Slurred speech. Last known well 3 p.m.  EXAM: CT HEAD WITHOUT CONTRAST  TECHNIQUE: Contiguous axial images were obtained from the base of the skull through the vertex without intravenous contrast.  RADIATION DOSE REDUCTION: This exam was performed according to the departmental dose-optimization program which includes automated exposure control, adjustment of the mA and/or kV according to patient size and/or use of iterative reconstruction technique.  COMPARISON:  Head CT 05/17/2022.  FINDINGS: Brain:  No age advanced or lobar predominant cerebral atrophy. Mild cerebellar atrophy.  Cavum septum pellucidum and cavum vergae (anatomic variant).  There is no acute intracranial hemorrhage.  No demarcated cortical infarct.  No extra-axial fluid collection.  No evidence of an intracranial mass.  No midline shift.  Vascular: No hyperdense vessel. Atherosclerotic calcifications.  Skull: No fracture or aggressive osseous lesion.  Sinuses/Orbits: No mass or acute finding within the imaged orbits. Minimal mucosal thickening within the bilateral ethmoid and maxillary sinuses.  ASPECTS ([place] Stroke Program  Early CT Score)  - Ganglionic level infarction (caudate, lentiform nuclei, internal capsule, insula, M1-M3 cortex): 7  - Supraganglionic infarction (M4-M6 cortex): 3  Total score (0-10 with 10 being normal): 10  These results were communicated to Dr. Leonel Ramsay at 6:52 pmon 11/3/2023by text page via the [facility] messaging system.  IMPRESSION: No evidence of acute intracranial abnormality.  Mild cerebellar atrophy.  Minimal mucosal thickening within the bilateral ethmoid and maxillary sinuses.  Electronically Signed   By: Kellie Simmering D.O.   On: 10/30/2022 18:53    Assessment and Plan: Bob Miller is a 71 y.o. male with medical history significant for MI status post CABG, type 2 diabetes, hypertension, CKD, who presented with dysarthria concerning for TIA versus stroke.  * Dysarthria Patient reporting difficulty with speech as well as what sounds like a superior bilateral visual field cut that lasted a maximum intensity for about an hour and has now mostly resolved.  CTA head and neck shows significant posterior circulation atherosclerotic disease but no acute lesions.  Presentation concerning for TIA versus stroke, unfortunately were unable to do an MRI at this time. - Appreciate neurology recommendations, they plan to see patient again in a.m. - TTE - Lipids, A1c - PT/OT/SLP - Telemetry - Continue DAPT - Consider discussing again with radiology in a.m. to obtain MRI  Known medical problems DM2-reduce home Basaglar from 30 units to 20 units, add high-dose sliding scale insulin correction factor.  Hold home metformin BPH-continue doxazosin, tamsulosin Hypertension/CAD-continue amlodipine, Imdur, losartan, Lopressor, aspirin, Plavix Hyperlipidemia-continue rosuvastatin Neuropathy-continue gabapentin      Advance Care Planning:   Code Status: Full Code   Consults: Neurology  Family Communication: Wife updated at bedside  Severity of Illness: The appropriate patient  status for this patient is INPATIENT. Inpatient status is judged to be reasonable and necessary in order to provide the required intensity of service to ensure the patient's safety. The patient's presenting symptoms, physical exam findings, and initial radiographic and laboratory data in the context of their chronic comorbidities is felt to place them at high risk for further clinical deterioration. Furthermore, it is not anticipated that the patient will be medically stable for discharge from the hospital within 2 midnights of admission.   * I certify that at the point of admission it is my clinical judgment that the patient will require inpatient hospital care spanning beyond 2 midnights from the point of admission due to high intensity of service, high risk for further deterioration and high frequency of surveillance required.*  Author: Clarnce Flock, MD 10/30/2022 9:46 PM  For on call review [email or web address].

## 2022-10-30 NOTE — ED Notes (Signed)
Wife at bedside.

## 2022-10-30 NOTE — ED Notes (Signed)
Called and activated code stroke to [place], [place]

## 2022-10-30 NOTE — Assessment & Plan Note (Signed)
Patient reporting difficulty with speech as well as what sounds like a superior bilateral visual field cut that lasted a maximum intensity for about an hour and has now mostly resolved.  CTA head and neck shows significant posterior circulation atherosclerotic disease but no acute lesions.  Presentation concerning for TIA versus stroke, unfortunately were unable to do an MRI at this time. - Appreciate neurology recommendations, they plan to see patient again in a.m. - TTE - Lipids, A1c - PT/OT/SLP - Telemetry - Continue DAPT - Consider discussing again with radiology in a.m. to obtain MRI

## 2022-10-31 ENCOUNTER — Encounter: Payer: Self-pay | Admitting: Family Medicine

## 2022-10-31 ENCOUNTER — Inpatient Hospital Stay
Admit: 2022-10-31 | Discharge: 2022-10-31 | Disposition: A | Discharge status: Home / Self Care | Payer: Medicare Other | Attending: Family Medicine | Admitting: Family Medicine

## 2022-10-31 DIAGNOSIS — I639 Cerebral infarction, unspecified: Secondary | ICD-10-CM | POA: Diagnosis not present

## 2022-10-31 DIAGNOSIS — I6523 Occlusion and stenosis of bilateral carotid arteries: Secondary | ICD-10-CM | POA: Diagnosis not present

## 2022-10-31 DIAGNOSIS — E1159 Type 2 diabetes mellitus with other circulatory complications: Secondary | ICD-10-CM | POA: Diagnosis not present

## 2022-10-31 DIAGNOSIS — R471 Dysarthria and anarthria: Secondary | ICD-10-CM | POA: Diagnosis not present

## 2022-10-31 DIAGNOSIS — R4701 Aphasia: Secondary | ICD-10-CM | POA: Diagnosis present

## 2022-10-31 DIAGNOSIS — E1122 Type 2 diabetes mellitus with diabetic chronic kidney disease: Secondary | ICD-10-CM | POA: Diagnosis not present

## 2022-10-31 LAB — LIPID PANEL
Cholesterol: 115 mg/dL (ref 0–200)
HDL: 24 mg/dL — ABNORMAL LOW (ref >40)
LDL Cholesterol: 16 mg/dL (ref 0–99)
Total CHOL/HDL Ratio: 4.8 RATIO
Triglycerides: 376 mg/dL — ABNORMAL HIGH (ref <150)
VLDL: 75 mg/dL — ABNORMAL HIGH (ref 0–40)

## 2022-10-31 LAB — ECHOCARDIOGRAM COMPLETE
AR max vel: 2.3 cm2
AV Peak grad: 5.7 mmHg
Ao pk vel: 1.19 m/s
Area-P 1/2: 2.66 cm2
Calc EF: 66.4 %
Height: 70 in
S' Lateral: 3.65 cm
Single Plane A2C EF: 61 %
Single Plane A4C EF: 75.2 %
Weight: 3827.19 oz

## 2022-10-31 LAB — HEMOGLOBIN A1C
Hgb A1c MFr Bld: 11 % — ABNORMAL HIGH (ref 4.8–5.6)
Mean Plasma Glucose: 269 mg/dL

## 2022-10-31 LAB — GLUCOSE, CAPILLARY
Glucose-Capillary: 260 mg/dL — ABNORMAL HIGH (ref 70–99)
Glucose-Capillary: 368 mg/dL — ABNORMAL HIGH (ref 70–99)

## 2022-10-31 MED ORDER — BASAGLAR KWIKPEN 100 UNIT/ML ~~LOC~~ SOPN: 35.0000 [IU] | PEN_INJECTOR | Freq: Every day | SUBCUTANEOUS | 3 refills | Status: DC

## 2022-10-31 MED ORDER — METFORMIN HCL 1000 MG PO TABS: 1000.0000 mg | ORAL_TABLET | Freq: Two times a day (BID) | ORAL | 3 refills | Status: DC

## 2022-10-31 MED ORDER — INSULIN GLARGINE-YFGN 100 UNIT/ML ~~LOC~~ SOLN
30.0000 [IU] | Freq: Every day | SUBCUTANEOUS | Status: DC
  Filled 2022-10-31: qty 0.3

## 2022-10-31 MED ORDER — PERFLUTREN LIPID MICROSPHERE
1.0000 mL | INTRAVENOUS | Status: AC | PRN
  Administered 2022-10-31: 4 mL via INTRAVENOUS

## 2022-10-31 NOTE — Progress Notes (Signed)
  Echocardiogram 2D Echocardiogram has been performed. Definity IV ultrasound enhancing agent used on this study.  Bob Miller 10/31/2022, 2:14 PM

## 2022-10-31 NOTE — Discharge Summary (Addendum)
Physician Discharge Summary   Patient: Bob Miller MRN: [id number] DOB: 02-06-1951  Admit date:     10/30/2022  Discharge date: 10/31/22  Discharge Physician: Estill Cotta, MD    PCP: Iva Boop, MD   Recommendations at discharge:    Continue ASA, plavix and statin with outpatient follow-up with neurology   Discharge Diagnoses:     TIA    Coronary artery disease involving coronary bypass graft of native heart without angina pectoris   Chronic kidney disease stage 3b ([place])   Hypertension associated with diabetes ([place])   Type 2 diabetes mellitus with other circulatory complications ([place]), uncontrolled with hyperglycemia     Obesity    Hospital Course:  Patient is 71 year old male with history of CAD status post CABG, type 2 diabetes mellitus, hypertension, CKD presented with difficulty speaking.  Patient reported that he went to take a nap around 2:30 PM on the day of admission, his wife tried to wake him at 4:30 PM but had difficulty.  He was finally able to get up around 5:10 PM and reported that he was slurring his speech.  In ED, patient reported that he was 99% better. UDS negative, serum alcohol level normal.  Glucose 271, creatinine 1.5 at baseline.  Vital signs notable for mild hypertension. Neurology was consulted.  Patient was admitted for further work-up. Patient was not able to get the MRI due to prior metal fragments in the patient's chest  Assessment and Plan:   Dysarthria likely TIA -In the setting of history of DM, HTN, hyperlipidemia, CAD -CT head negative for acute intracranial abnormality CTA head showed no intracranial large vessel occlusion.  Intracranial atherosclerotic disease with multifocal stenosis, severe stenosis of the right vertebral artery at V3 V4 junction.  Mild to moderate stenosis within the bilateral cavernous internal carotid arteries and left V4 -CTA neck showed atherosclerotic plaque bilaterally, estimated 20 to 80% stenosis of  proximal right ICA and estimated 70% stenosis within proximal left ICA. -2D echocardiogram  showed EF 55-60%, no WMA  -Uncontrolled diabetes mellitus, hemoglobin A1c 11.0 -Lipid panel showed HDL 24, LDL 16, triglycerides 276, started on statin - Symptoms have improved, ambulating -Continue aspirin, Plavix, statin          Coronary artery disease involving coronary bypass graft of native heart without angina pectoris -Currently no acute chest pain or shortness of breath, continue aspirin, Plavix, statin, Imdur, losartan, beta-blocker     Chronic kidney disease stage 3b  ([facility])  -Creatinine appears to be at baseline, presented with creatinine of 1.5, was 2.0 in 05/17/2022       Hypertension associated with diabetes ([place]) -BP currently stable, continue outpatient antihypertensives     Type 2 diabetes mellitus with other circulatory complications ([place]), uncontrolled with hyperglycemia - hemoglobin A1c 11.0 -Counseled strongly on better glycemic control -Increased Basglar to 35units at bed time, increased metformin to '1000mg'$  BID       Obesity Estimated body mass index is 34.32 kg/m as calculated from the following:   Height as of this encounter: '5\' 10"'$  (1.778 m).   Weight as of this encounter: 108.5 kg.      Pain control - [employer] Controlled Substance Reporting System database was reviewed. and patient was instructed, not to drive, operate heavy machinery, perform activities at heights, swimming or participation in water activities or provide baby-sitting services while on Pain, Sleep and Anxiety Medications; until their outpatient Physician has advised to do so again. Also recommended to not to take more  than prescribed Pain, Sleep and Anxiety Medications.  Consultants: neurology  Procedures performed: CTA head and neck, echo  Disposition: Home Diet recommendation:  Discharge Diet Orders (From admission, onward)     Start     Ordered   10/31/22 0000  Diet - low sodium  heart healthy        10/31/22 1526           Carb modified diet DISCHARGE MEDICATION: Allergies as of 10/31/2022       Reactions   Codeine    anxiety   Crestor [rosuvastatin]    Muscle pain   Lipitor [atorvastatin]    Muscle pain   Tramadol         Medication List     STOP taking these medications    metFORMIN 500 MG 24 hr tablet Commonly known as: GLUCOPHAGE-XR Replaced by: metFORMIN 1000 MG tablet       TAKE these medications    amLODipine 10 MG tablet Commonly known as: NORVASC Take 10 mg by mouth daily.   aspirin EC 81 MG tablet Take 81 mg by mouth daily.   Basaglar KwikPen 100 UNIT/ML Inject 35 Units into the skin at bedtime. What changed: how much to take   clopidogrel 75 MG tablet Commonly known as: PLAVIX Take 75 mg by mouth daily.   cyanocobalamin 1000 MCG tablet Take 1 tablet by mouth daily.   doxazosin 2 MG tablet Commonly known as: CARDURA Take 2 mg by mouth daily.   gabapentin 100 MG capsule Commonly known as: NEURONTIN Take 100 mg by mouth 2 (two) times daily.   glucosamine-chondroitin 500-400 MG tablet Take 1 tablet by mouth daily.   isosorbide mononitrate 60 MG 24 hr tablet Commonly known as: IMDUR Take 60 mg by mouth daily.   losartan 100 MG tablet Commonly known as: COZAAR Take 100 mg by mouth daily.   metFORMIN 1000 MG tablet Commonly known as: GLUCOPHAGE Take 1 tablet (1,000 mg total) by mouth 2 (two) times daily with a meal. Replaces: metFORMIN 500 MG 24 hr tablet   metoprolol tartrate 100 MG tablet Commonly known as: LOPRESSOR Take 50 mg by mouth 2 (two) times daily.   nitroGLYCERIN 0.4 MG SL tablet Commonly known as: NITROSTAT Place 0.4 mg under the tongue every 5 (five) minutes as needed for chest pain.   pantoprazole 40 MG tablet Commonly known as: PROTONIX Take 40 mg by mouth daily.   rosuvastatin 20 MG tablet Commonly known as: CRESTOR Take 20 mg by mouth daily.   tamsulosin 0.4 MG Caps  capsule Commonly known as: FLOMAX Take 1 capsule (0.4 mg total) by mouth daily.   Vitamin D (Ergocalciferol) 1.25 MG (50000 UNIT) Caps capsule Commonly known as: DRISDOL Take 50,000 Units by mouth once a week.        Follow-up Information     Iva Boop, MD. Schedule an appointment as soon as possible for a visit in 2 week(s).   Specialty: Internal Medicine Why: for hospital follow-up Contact information: [place] [id number] [place]. Schedule an appointment as soon as possible for a visit in 4 week(s).   Why: for hospital follow-up Contact information: [place] [phone]               Discharge Exam: Danley Danker Weights   10/30/22 2211 10/31/22 0433  Weight: 103.9 kg 108.5 kg   S:  doing well, dysarthria resolved   Vitals:   10/31/22 0433 10/31/22 0759 10/31/22 0831 10/31/22 1153  BP: (!) 143/70 (!) 144/64  (!) 145/58  Pulse: (!) 53 (!) 58 62 (!) 59  Resp: 18 20    Temp: 97.8 F (36.6 C) 98 F (36.7 C)  98 F (36.7 C)  TempSrc:      SpO2: 97% 98%  98%  Weight: 108.5 kg     Height:        Physical Exam General: Alert and oriented x 3, NAD Cardiovascular: S1 S2 clear, RRR.  Respiratory: CTAB, no wheezing, rales or rhonchi Gastrointestinal: Soft, nontender, nondistended, NBS Ext: no pedal edema bilaterally Neuro: no new deficits Psych: Normal affect and demeanor, alert and oriented x3    Condition at discharge: fair  The results of significant diagnostics from this hospitalization (including imaging, microbiology, ancillary and laboratory) are listed below for reference.   Imaging Studies: ECHOCARDIOGRAM COMPLETE  Result Date: 10/31/2022    ECHOCARDIOGRAM REPORT   Patient Name:   Kalel A Arena Date of Exam: 10/31/2022 Medical Rec #:  [id number]   Height:       70.0 in Accession #:    [id number]  Weight:       239.2  lb Date of Birth:  29-Oct-1951   BSA:          2.252 m Patient Age:    71 years    BP:           145/58 mmHg Patient Gender: M           HR:           59 bpm. Exam Location:  [facility] Procedure: 2D Echo and Intracardiac Opacification Agent Indications:     TIA G45.9  History:         Patient has no prior history of Echocardiogram examinations.  Sonographer:     Kathlen Brunswick RDCS Referring Phys:  [id number] [place] M ECKSTAT Diagnosing Phys: Yolonda Kida MD  Sonographer Comments: Technically challenging study due to limited acoustic windows, suboptimal subcostal window, suboptimal apical window and patient is obese. Image acquisition challenging due to patient body habitus. IMPRESSIONS  1. Left ventricular ejection fraction, by estimation, is 55 to 60%. The left ventricle has normal function. The left ventricle has no regional wall motion abnormalities. There is mild concentric left ventricular hypertrophy. Left ventricular diastolic parameters were normal.  2. Right ventricular systolic function is normal. The right ventricular size is normal.  3. The mitral valve was not well visualized. No evidence of mitral valve regurgitation.  4. The aortic valve is normal in structure. Aortic valve regurgitation is not visualized. FINDINGS  Left Ventricle: Left ventricular ejection fraction, by estimation, is 55 to 60%. The left ventricle has normal function. The left ventricle has no regional wall motion abnormalities. Definity contrast agent was given IV to delineate the left ventricular  endocardial borders. The left ventricular internal cavity size was normal in size. There is mild concentric left ventricular hypertrophy. Left ventricular diastolic parameters were normal. Right Ventricle: The right ventricular size is normal. No increase in right ventricular wall thickness. Right ventricular systolic function is normal. Left Atrium: Left atrial size was normal in size. Right Atrium: Right atrial size was normal in size.  Pericardium: There is no evidence of pericardial effusion. Mitral Valve: The mitral valve was not well visualized. No evidence of mitral valve regurgitation. Tricuspid Valve: The tricuspid valve is normal in structure. Tricuspid valve regurgitation is not  demonstrated. Aortic Valve: The aortic valve is normal in structure. Aortic valve regurgitation is not visualized. Aortic valve peak gradient measures 5.7 mmHg. Pulmonic Valve: The pulmonic valve was normal in structure. Pulmonic valve regurgitation is not visualized. Aorta: The ascending aorta was not well visualized. IAS/Shunts: No atrial level shunt detected by color flow Doppler.  LEFT VENTRICLE PLAX 2D LVIDd:         5.25 cm      Diastology LVIDs:         3.65 cm      LV e' medial:    8.59 cm/s LV PW:         1.20 cm      LV E/e' medial:  12.7 LV IVS:        1.25 cm      LV e' lateral:   6.96 cm/s LVOT diam:     2.10 cm      LV E/e' lateral: 15.7 LV SV:         63 LV SV Index:   28 LVOT Area:     3.46 cm  LV Volumes (MOD) LV vol d, MOD A2C: 111.0 ml LV vol d, MOD A4C: 120.0 ml LV vol s, MOD A2C: 43.3 ml LV vol s, MOD A4C: 29.8 ml LV SV MOD A2C:     67.7 ml LV SV MOD A4C:     120.0 ml LV SV MOD BP:      77.8 ml RIGHT VENTRICLE RV Basal diam:  2.90 cm RV S prime:     9.68 cm/s TAPSE (M-mode): 2.0 cm LEFT ATRIUM             Index        RIGHT ATRIUM           Index LA diam:        4.20 cm 1.86 cm/m   RA Area:     10.30 cm LA Vol (A2C):   66.3 ml 29.44 ml/m  RA Volume:   20.40 ml  9.06 ml/m LA Vol (A4C):   52.7 ml 23.40 ml/m LA Biplane Vol: 61.3 ml 27.22 ml/m  AORTIC VALVE                 PULMONIC VALVE AV Area (Vmax): 2.30 cm     PV Vmax:       1.34 m/s AV Vmax:        119.00 cm/s  PV Peak grad:  7.2 mmHg AV Peak Grad:   5.7 mmHg LVOT Vmax:      79.10 cm/s LVOT Vmean:     51.400 cm/s LVOT VTI:       0.182 m  AORTA Ao Root diam: 3.50 cm Ao Asc diam:  3.00 cm MITRAL VALVE                TRICUSPID VALVE MV Area (PHT): 2.66 cm     TR Peak grad:   20.4 mmHg  MV Decel Time: 285 msec     TR Vmax:        226.00 cm/s MV E velocity: 109.00 cm/s MV A velocity: 108.00 cm/s  SHUNTS MV E/A ratio:  1.01         Systemic VTI:  0.18 m                             Systemic Diam: 2.10 cm Yolonda Kida MD Electronically signed by Yolonda Kida MD  Signature Date/Time: 10/31/2022/2:55:15 PM    Final    DG Chest Portable 1 View  Result Date: 10/30/2022 CLINICAL DATA:  Altered mental status, code stroke, slurred speech EXAM: PORTABLE CHEST 1 VIEW COMPARISON:  05/17/2022 FINDINGS: Lungs are clear.  No pleural effusion or pneumothorax. The heart is normal in size. Postsurgical changes related to prior CABG. Median sternotomy. IMPRESSION: No evidence of acute cardiopulmonary disease. Electronically Signed   By: Julian Hy M.D.   On: 10/30/2022 20:53   CT ANGIO HEAD NECK W WO CM (CODE STROKE)  Result Date: 10/30/2022 CLINICAL DATA:  Provided history: Neuro deficit, acute, stroke suspected. Additional history provided: Slurred speech, last known well 1500 EXAM: CT ANGIOGRAPHY HEAD AND NECK TECHNIQUE: Multidetector CT imaging of the head and neck was performed using the standard protocol during bolus administration of intravenous contrast. Multiplanar CT image reconstructions and MIPs were obtained to evaluate the vascular anatomy. Carotid stenosis measurements (when applicable) are obtained utilizing NASCET criteria, using the distal internal carotid diameter as the denominator. RADIATION DOSE REDUCTION: This exam was performed according to the departmental dose-optimization program which includes automated exposure control, adjustment of the mA and/or kV according to patient size and/or use of iterative reconstruction technique. CONTRAST:  32m OMNIPAQUE IOHEXOL 350 MG/ML SOLN COMPARISON:  Noncontrast head CT performed earlier today 10/30/2022. FINDINGS: CTA NECK FINDINGS Aortic arch: Common origin of the innominate and left common carotid arteries. Atherosclerotic plaque  within the visualized aortic arch and proximal major branch vessels of the neck. No hemodynamically significant innominate or proximal subclavian artery stenosis. Right carotid system: CCA and ICA patent within the neck. Atherosclerotic plaque scattered throughout the CCA, about the carotid bifurcation, and within the cervical ICA. Calcified atherosclerotic plaque about the carotid bifurcation results in less than 50% stenosis at the ICA origin. However, calcified plaque more distally within the proximal ICA results in estimated 70-80% stenosis (series 7, image 206). Left carotid system: CCA and ICA patent within the neck. Atherosclerotic plaque within the CCA, about the carotid bifurcation and within the cervical ICA. Most notably, calcified atherosclerotic plaque within the proximal cervical ICA results in estimated 70% stenosis. Vertebral arteries: The right vertebral artery is patent within the neck. Atherosclerotic plaque scattered throughout this vessel. Most notably, atherosclerotic plaque at the origin of the right vertebral artery results in apparent severe stenosis, and there is a severe atherosclerotic stenosis within the right vertebral artery at the V3/V4 junction. The left vertebral artery is patent within the neck atherosclerotic plaque scattered throughout this vessel. Most notably, there are sites of up to 50% stenosis within the P2 segment. Skeleton: Spondylosis at the cervical and visualized upper thoracic levels. Facet joint ankylosis on the right at C4-C5. No acute fracture or aggressive osseous lesion. Other neck: No neck mass or cervical lymphadenopathy. Upper chest: No consolidation within the imaged lung apices. Prior median sternotomy. Review of the MIP images confirms the above findings CTA HEAD FINDINGS Anterior circulation: The intracranial internal carotid arteries are patent. Atherosclerotic plaque within both vessels. Mild to moderate stenosis within the cavernous segments,  bilaterally. The M1 middle cerebral arteries are patent. Atherosclerotic irregularity of the M2 and more distal MCA vessels, bilaterally. No M2 proximal branch occlusion or high-grade proximal stenosis is identified. The anterior cerebral arteries are patent. No intracranial aneurysm is identified. Posterior circulation: The intracranial vertebral arteries are patent. Severe stenosis within the right vertebral artery at the V3/V4 junction. Calcified atherosclerotic plaque within the proximal V4 left vertebral artery with mild-to-moderate stenosis. The  basilar artery is patent. The posterior cerebral arteries are patent. Posterior communicating arteries are diminutive or absent, bilaterally. Venous sinuses: Within the limitations of contrast timing, no convincing thrombus. Anatomic variants: As described Review of the MIP images confirms the above findings These results were communicated to Dr. Leonel Ramsay at 7:39 pmon 11/3/2023by text page via the [facility] messaging system. IMPRESSION: CTA neck: 1. The common carotid and internal carotid arteries are patent within the neck. Atherosclerotic plaque, bilaterally. Most notably, there is an estimated 70-80% stenosis within the proximal right ICA and estimated 70% stenosis within the proximal left ICA. 2. Vertebral arteries patent within the neck. Atherosclerotic plaque, bilaterally. Most notably, there are sites of severe atherosclerotic narrowing within the right vertebral artery at its origin and at the V3/V4 junction. Sites of up to 50% stenosis within the left V2 segment. 3.  Aortic Atherosclerosis (ICD10-I70.0). CTA head: 1. No intracranial large vessel occlusion is identified. 2. Intracranial atherosclerotic disease with multifocal stenoses, most notably as follows. 3. Severe stenosis within the right vertebral artery at the V3/V4 junction. 4. Mild-to-moderate stenoses within the bilateral cavernous internal carotid arteries, and within the left vertebral artery V4  segment. Electronically Signed   By: Kellie Simmering D.O.   On: 10/30/2022 19:40   CT HEAD CODE STROKE WO CONTRAST  Result Date: 10/30/2022 CLINICAL DATA:  Code stroke. Provided history: Neuro deficit, acute, stroke suspected. Additional history provided: Slurred speech. Last known well 3 p.m. EXAM: CT HEAD WITHOUT CONTRAST TECHNIQUE: Contiguous axial images were obtained from the base of the skull through the vertex without intravenous contrast. RADIATION DOSE REDUCTION: This exam was performed according to the departmental dose-optimization program which includes automated exposure control, adjustment of the mA and/or kV according to patient size and/or use of iterative reconstruction technique. COMPARISON:  Head CT 05/17/2022. FINDINGS: Brain: No age advanced or lobar predominant cerebral atrophy. Mild cerebellar atrophy. Cavum septum pellucidum and cavum vergae (anatomic variant). There is no acute intracranial hemorrhage. No demarcated cortical infarct. No extra-axial fluid collection. No evidence of an intracranial mass. No midline shift. Vascular: No hyperdense vessel. Atherosclerotic calcifications. Skull: No fracture or aggressive osseous lesion. Sinuses/Orbits: No mass or acute finding within the imaged orbits. Minimal mucosal thickening within the bilateral ethmoid and maxillary sinuses. ASPECTS ([place] Stroke Program Early CT Score) - Ganglionic level infarction (caudate, lentiform nuclei, internal capsule, insula, M1-M3 cortex): 7 - Supraganglionic infarction (M4-M6 cortex): 3 Total score (0-10 with 10 being normal): 10 These results were communicated to Dr. Leonel Ramsay at 6:52 pmon 11/3/2023by text page via the [facility] messaging system. IMPRESSION: No evidence of acute intracranial abnormality. Mild cerebellar atrophy. Minimal mucosal thickening within the bilateral ethmoid and maxillary sinuses. Electronically Signed   By: Kellie Simmering D.O.   On: 10/30/2022 18:53    Microbiology: Results for orders  placed or performed during the hospital encounter of 10/30/22  Resp Panel by RT-PCR (Flu A&B, Covid) Anterior Nasal Swab     Status: None   Collection Time: 10/30/22  7:39 PM   Specimen: Anterior Nasal Swab  Result Value Ref Range Status   SARS Coronavirus 2 by RT PCR NEGATIVE NEGATIVE Final    Comment: (NOTE) SARS-CoV-2 target nucleic acids are NOT DETECTED.  The SARS-CoV-2 RNA is generally detectable in upper respiratory specimens during the acute phase of infection. The lowest concentration of SARS-CoV-2 viral copies this assay can detect is 138 copies/mL. A negative result does not preclude SARS-Cov-2 infection and should not be used as the sole basis for  treatment or other patient management decisions. A negative result may occur with  improper specimen collection/handling, submission of specimen other than nasopharyngeal swab, presence of viral mutation(s) within the areas targeted by this assay, and inadequate number of viral copies(<138 copies/mL). A negative result must be combined with clinical observations, patient history, and epidemiological information. The expected result is Negative.  Fact Sheet for Patients:  EntrepreneurPulse.com.au  Fact Sheet for Healthcare Providers:  IncredibleEmployment.be  This test is no t yet approved or cleared by the [place] FDA and  has been authorized for detection and/or diagnosis of SARS-CoV-2 by FDA under an Emergency Use Authorization (EUA). This EUA will remain  in effect (meaning this test can be used) for the duration of the COVID-19 declaration under Section 564(b)(1) of the Act, 21 U.S.C.section 360bbb-3(b)(1), unless the authorization is terminated  or revoked sooner.       Influenza A by PCR NEGATIVE NEGATIVE Final   Influenza B by PCR NEGATIVE NEGATIVE Final    Comment: (NOTE) The Xpert Xpress SARS-CoV-2/FLU/RSV plus assay is intended as an aid in the diagnosis of influenza  from Nasopharyngeal swab specimens and should not be used as a sole basis for treatment. Nasal washings and aspirates are unacceptable for Xpert Xpress SARS-CoV-2/FLU/RSV testing.  Fact Sheet for Patients: EntrepreneurPulse.com.au  Fact Sheet for Healthcare Providers: IncredibleEmployment.be  This test is not yet approved or cleared by the [place] FDA and has been authorized for detection and/or diagnosis of SARS-CoV-2 by FDA under an Emergency Use Authorization (EUA). This EUA will remain in effect (meaning this test can be used) for the duration of the COVID-19 declaration under Section 564(b)(1) of the Act, 21 U.S.C. section 360bbb-3(b)(1), unless the authorization is terminated or revoked.  Performed at [facility], [address] [place], [place] [address]     Labs: CBC: Recent Labs  Lab 10/30/22 1842  WBC 9.3  NEUTROABS 4.7  HGB 13.7  HCT 40.7  MCV 83.6  PLT 119   Basic Metabolic Panel: Recent Labs  Lab 10/30/22 1842 10/30/22 1850  NA 137  --   K 3.9  --   CL 104  --   CO2 25  --   GLUCOSE 271*  --   BUN 21  --   CREATININE 1.52* 1.60*  CALCIUM 9.0  --    Liver Function Tests: Recent Labs  Lab 10/30/22 1842  AST 21  ALT 25  ALKPHOS 60  BILITOT 1.2  PROT 6.5  ALBUMIN 3.7   CBG: Recent Labs  Lab 10/30/22 2219 10/31/22 0727 10/31/22 1153  GLUCAP 297* 260* 368*    Discharge time spent: greater than 30 minutes.  Signed: Estill Cotta, MD [facility] 10/31/2022

## 2022-10-31 NOTE — Progress Notes (Signed)
Subjective: Feels completely resolved.  He tells me today that it was not just dysarthria, he also noticed visual changes during the event.  Exam: Vitals:   10/31/22 0759 10/31/22 0831  BP: (!) 144/64   Pulse: (!) 58 62  Resp: 20   Temp: 98 F (36.7 C)   SpO2: 98%    Gen: In bed, NAD Resp: non-labored breathing, no acute distress Abd: soft, nt  Neuro: MS: Awake, alert, oriented and appropriate CN: Pupils round and reactive to light, extraocular movements intact, visual fields full Motor: 5/5 throughout Sensory: Intact light touch  Pertinent Labs: LDL 16 A1c 11  CTA-significant intracranial atherosclerotic disease.   Impression: 71 year old male with a history of dm, htn, hpl, who presents with transient slurred speech and visual disturbance.  He cannot get an MRI due to a question of possible shrapnel.  With resolution of his symptoms, I do think that this was most likely TIA.  He is currently undergoing evaluation.    Recommendations: 1) LDL is good 2) I strongly encouraged him to work with his PCP on DM control, goal LDL less than seven 3) I mentioned the Mediterranean diet in relation to stroke prevention 4) advised him to stop using nicotine 5) echocardiogram is pending 6) neurology will continue to follow  [name], MD [facility] [phone]  If 7pm- 7am, please page neurology on call as listed in [place].

## 2022-10-31 NOTE — Plan of Care (Signed)
  Problem: Education: Goal: Ability to describe self-care measures that may prevent or decrease complications (Diabetes Survival Skills Education) will improve Outcome: Progressing Goal: Individualized Educational Video(s) Outcome: Progressing   Problem: Coping: Goal: Ability to adjust to condition or change in health will improve Outcome: Progressing   Problem: Fluid Volume: Goal: Ability to maintain a balanced intake and output will improve Outcome: Progressing   Problem: Health Behavior/Discharge Planning: Goal: Ability to identify and utilize available resources and services will improve Outcome: Progressing Goal: Ability to manage health-related needs will improve Outcome: Progressing   Problem: Metabolic: Goal: Ability to maintain appropriate glucose levels will improve Outcome: Progressing   Problem: Nutritional: Goal: Maintenance of adequate nutrition will improve Outcome: Progressing Goal: Progress toward achieving an optimal weight will improve Outcome: Progressing   Problem: Skin Integrity: Goal: Risk for impaired skin integrity will decrease Outcome: Progressing   Problem: Tissue Perfusion: Goal: Adequacy of tissue perfusion will improve Outcome: Progressing   Problem: Education: Goal: Knowledge of disease or condition will improve Outcome: Progressing Goal: Knowledge of secondary prevention will improve (MUST DOCUMENT ALL) Outcome: Progressing Goal: Knowledge of patient specific risk factors will improve Elta [place] N/A or DELETE if not current risk factor) Outcome: Progressing   Problem: Ischemic Stroke/TIA Tissue Perfusion: Goal: Complications of ischemic stroke/TIA will be minimized Outcome: Progressing   Problem: Coping: Goal: Will verbalize positive feelings about self Outcome: Progressing Goal: Will identify appropriate support needs Outcome: Progressing   Problem: Health Behavior/Discharge Planning: Goal: Ability to manage health-related needs  will improve Outcome: Progressing Goal: Goals will be collaboratively established with patient/family Outcome: Progressing   Problem: Self-Care: Goal: Ability to participate in self-care as condition permits will improve Outcome: Progressing Goal: Verbalization of feelings and concerns over difficulty with self-care will improve Outcome: Progressing Goal: Ability to communicate needs accurately will improve Outcome: Progressing   Problem: Nutrition: Goal: Risk of aspiration will decrease Outcome: Progressing Goal: Dietary intake will improve Outcome: Progressing   Problem: Education: Goal: Knowledge of General Education information will improve Description: Including pain rating scale, medication(s)/side effects and non-pharmacologic comfort measures Outcome: Progressing   Problem: Health Behavior/Discharge Planning: Goal: Ability to manage health-related needs will improve Outcome: Progressing   Problem: Clinical Measurements: Goal: Ability to maintain clinical measurements within normal limits will improve Outcome: Progressing Goal: Will remain free from infection Outcome: Progressing Goal: Diagnostic test results will improve Outcome: Progressing Goal: Respiratory complications will improve Outcome: Progressing Goal: Cardiovascular complication will be avoided Outcome: Progressing   Problem: Activity: Goal: Risk for activity intolerance will decrease Outcome: Progressing   Problem: Nutrition: Goal: Adequate nutrition will be maintained Outcome: Progressing   Problem: Coping: Goal: Level of anxiety will decrease Outcome: Progressing   Problem: Elimination: Goal: Will not experience complications related to bowel motility Outcome: Progressing Goal: Will not experience complications related to urinary retention Outcome: Progressing   Problem: Pain Managment: Goal: General experience of comfort will improve Outcome: Progressing   Problem: Safety: Goal:  Ability to remain free from injury will improve Outcome: Progressing   Problem: Skin Integrity: Goal: Risk for impaired skin integrity will decrease Outcome: Progressing

## 2022-10-31 NOTE — Progress Notes (Signed)
PT Cancellation Note  Patient Details Name: STRIDER VALLANCE MRN: [id number] DOB: 13-Apr-1951   Cancelled Treatment:    Reason Eval/Treat Not Completed: PT screened, no needs identified, will sign off PT orders received, chart reviewed. Per OT, pt does not have any PT needs at this time. Spoke with pt who declines any changes in strength or sensation, noting his speech & vision where the only deficits but they resolved & returned to baseline within an hour after arriving to the hospital. Pt reports he's been ambulating in his room & feels he's at his baseline. Pt does not require acute PT services at this time. PT to complete orders.  Lavone Nian, PT, DPT 10/31/22, 11:30 AM  Waunita Schooner 10/31/2022, 11:29 AM

## 2022-10-31 NOTE — Progress Notes (Signed)
[facility]                                                                              Bob Miller, is a 71 y.o. male, DOB - 1951-01-29, YIF:[id number] Admit date - 10/30/2022    Outpatient Primary MD for the patient is Celesta Aver, Nance Pew, MD  LOS - 1  days  Chief Complaint  Patient presents with   Aphasia       Brief summary   Patient is 71 year old male with history of CAD status post CABG, type 2 diabetes mellitus, hypertension, CKD presented with difficulty speaking.  Patient reported that he went to take a nap around 2:30 PM on the day of admission, his wife tried to wake him at 4:30 PM but had difficulty.  He was finally able to get up around 5:10 PM and reported that he was slurring his speech.  In ED, patient reported that he was 99% better. UDS negative, serum alcohol level normal.  Glucose 271, creatinine 1.5 at baseline.  Vital signs notable for mild hypertension. Neurology was consulted.  Patient was admitted for further work-up. Patient was not able to get the MRI due to prior metal fragments in the patient's chest   Assessment & Plan    Principal Problem:   Dysarthria likely TIA -In the setting of history of DM, HTN, hyperlipidemia, CAD -CT head negative for acute intracranial abnormality CTA head showed no intracranial large vessel occlusion.  Intracranial atherosclerotic disease with multifocal stenosis, severe stenosis of the right vertebral artery at V3 V4 junction.  Mild to moderate stenosis within the bilateral cavernous internal carotid arteries and left V4 -CTA neck showed atherosclerotic plaque bilaterally, estimated 20 to 80% stenosis of proximal right ICA and estimated 70% stenosis within proximal left ICA. -2D echocardiogram pending -Uncontrolled diabetes mellitus, hemoglobin A1c 11.0 -Lipid panel showed HDL 24, -16, triglycerides 276, started on statin - Symptoms have improved, ambulating -Continue aspirin, Plavix,  statin    Active Problems:   Coronary artery disease involving coronary bypass graft of native heart without angina pectoris -Currently no acute chest pain or shortness of breath, continue aspirin, Plavix, statin, Imdur, losartan, beta-blocker    Chronic kidney disease due to type 2 diabetes mellitus ([place])  -Creatinine appears to be at baseline, presented with creatinine of 1.5, was 2.0 in 05/17/2022     Hypertension associated with diabetes ([place]) -BP currently stable, continue outpatient antihypertensives    Type 2 diabetes mellitus with other circulatory complications ([place]), uncontrolled with hyperglycemia hemoglobin A1c 11.0 -Counseled strongly on better glycemic control -Continue Semglee 30 units daily, resistant sliding scale insulin  Recent Labs    10/30/22 2219 10/31/22 0727 10/31/22 1153  GLUCAP 297* 260* 368*     Obesity Estimated body mass index is 34.32 kg/m as calculated from the following:   Height as of this encounter: '5\' 10"'$  (1.778 m).   Weight as of this encounter: 108.5 kg.  Code Status: Full code DVT Prophylaxis:     Level of Care: Level of care: Telemetry Medical Family Communication: Updated patient, alert and oriented  Disposition Plan:      Remains inpatient appropriate:  Pending 2D echo.  Patient has been threatening to leave AMA however has been counseled.  He has been upset about hospital bill and 2D echo.  Requested with echo tech to expedite.  Procedures:  CTA head and neck  Consultants:   Neurology  Antimicrobials:   Medications  amLODipine  10 mg Oral Daily   aspirin EC  81 mg Oral Daily   clopidogrel  75 mg Oral Daily   doxazosin  2 mg Oral Daily   enoxaparin (LOVENOX) injection  0.5 mg/kg Subcutaneous Q24H   gabapentin  100 mg Oral BID   insulin aspart  0-20 Units Subcutaneous TID WC   insulin aspart  0-5 Units Subcutaneous QHS   insulin glargine-yfgn  30 Units Subcutaneous QHS   isosorbide mononitrate  60 mg Oral Daily    losartan  100 mg Oral Daily   metoprolol tartrate  50 mg Oral BID   pantoprazole  40 mg Oral Daily   rosuvastatin  20 mg Oral Daily   tamsulosin  0.4 mg Oral Daily      Subjective:   Bob Miller was seen and examined today.  No acute complaints, no dysarthria, nausea vomiting, focal weakness.  Patient denies dizziness, chest pain, shortness of breath, abdominal pain, N/V/D.  Objective:   Vitals:   10/31/22 0432 10/31/22 0433 10/31/22 0759 10/31/22 0831  BP: (!) 143/70 (!) 143/70 (!) 144/64   Pulse: (!) 53 (!) 53 (!) 58 62  Resp: '18 18 20   '$ Temp:  97.8 F (36.6 C) 98 F (36.7 C)   TempSrc:      SpO2: 97% 97% 98%   Weight:  108.5 kg    Height:       No intake or output data in the 24 hours ending 10/31/22 1106   Wt Readings from Last 3 Encounters:  10/31/22 108.5 kg  06/25/22 102.3 kg  06/18/22 103.6 kg     Exam General: Alert and oriented x 3, NAD Cardiovascular: S1 S2 auscultated,  RRR Respiratory: Clear to auscultation bilaterally, Gastrointestinal: Soft, nontender, nondistended, + bowel sounds Ext: no pedal edema bilaterally Neuro: Strength 5/5 upper and lower extremities bilaterally Psych: Normal affect and demeanor, alert and oriented x3     Data Reviewed:  I have personally reviewed following labs    CBC Lab Results  Component Value Date   WBC 9.3 10/30/2022   RBC 4.87 10/30/2022   HGB 13.7 10/30/2022   HCT 40.7 10/30/2022   MCV 83.6 10/30/2022   MCH 28.1 10/30/2022   PLT 170 10/30/2022   MCHC 33.7 10/30/2022   RDW 14.0 10/30/2022   LYMPHSABS 3.4 10/30/2022   MONOABS 0.8 10/30/2022   EOSABS 0.3 10/30/2022   BASOSABS 0.1 40/98/1191     Last metabolic panel Lab Results  Component Value Date   NA 137 10/30/2022   K 3.9 10/30/2022   CL 104 10/30/2022   CO2 25 10/30/2022   BUN 21 10/30/2022   CREATININE 1.60 (H) 10/30/2022   GLUCOSE 271 (H) 10/30/2022   GFRNONAA 49 (L) 10/30/2022   CALCIUM 9.0 10/30/2022   PROT 6.5 10/30/2022   ALBUMIN  3.7 10/30/2022   BILITOT 1.2 10/30/2022   ALKPHOS 60 10/30/2022   AST 21 10/30/2022   ALT 25 10/30/2022   ANIONGAP 8 10/30/2022    CBG (last 3)  Recent Labs    10/30/22 2219 10/31/22 0727  GLUCAP 297* 260*      Coagulation Profile: Recent Labs  Lab 10/30/22 1842  INR 1.0  Radiology Studies: I have personally reviewed the imaging studies  DG Chest Portable 1 View  Result Date: 10/30/2022 CLINICAL DATA:  Altered mental status, code stroke, slurred speech EXAM: PORTABLE CHEST 1 VIEW COMPARISON:  05/17/2022 FINDINGS: Lungs are clear.  No pleural effusion or pneumothorax. The heart is normal in size. Postsurgical changes related to prior CABG. Median sternotomy. IMPRESSION: No evidence of acute cardiopulmonary disease. Electronically Signed   By: Julian Hy M.D.   On: 10/30/2022 20:53   CT ANGIO HEAD NECK W WO CM (CODE STROKE)  Result Date: 10/30/2022 CLINICAL DATA:  Provided history: Neuro deficit, acute, stroke suspected. Additional history provided: Slurred speech, last known well 1500 EXAM: CT ANGIOGRAPHY HEAD AND NECK TECHNIQUE: Multidetector CT imaging of the head and neck was performed using the standard protocol during bolus administration of intravenous contrast. Multiplanar CT image reconstructions and MIPs were obtained to evaluate the vascular anatomy. Carotid stenosis measurements (when applicable) are obtained utilizing NASCET criteria, using the distal internal carotid diameter as the denominator. RADIATION DOSE REDUCTION: This exam was performed according to the departmental dose-optimization program which includes automated exposure control, adjustment of the mA and/or kV according to patient size and/or use of iterative reconstruction technique. CONTRAST:  59m OMNIPAQUE IOHEXOL 350 MG/ML SOLN COMPARISON:  Noncontrast head CT performed earlier today 10/30/2022. FINDINGS: CTA NECK FINDINGS Aortic arch: Common origin of the innominate and left common carotid  arteries. Atherosclerotic plaque within the visualized aortic arch and proximal major branch vessels of the neck. No hemodynamically significant innominate or proximal subclavian artery stenosis. Right carotid system: CCA and ICA patent within the neck. Atherosclerotic plaque scattered throughout the CCA, about the carotid bifurcation, and within the cervical ICA. Calcified atherosclerotic plaque about the carotid bifurcation results in less than 50% stenosis at the ICA origin. However, calcified plaque more distally within the proximal ICA results in estimated 70-80% stenosis (series 7, image 206). Left carotid system: CCA and ICA patent within the neck. Atherosclerotic plaque within the CCA, about the carotid bifurcation and within the cervical ICA. Most notably, calcified atherosclerotic plaque within the proximal cervical ICA results in estimated 70% stenosis. Vertebral arteries: The right vertebral artery is patent within the neck. Atherosclerotic plaque scattered throughout this vessel. Most notably, atherosclerotic plaque at the origin of the right vertebral artery results in apparent severe stenosis, and there is a severe atherosclerotic stenosis within the right vertebral artery at the V3/V4 junction. The left vertebral artery is patent within the neck atherosclerotic plaque scattered throughout this vessel. Most notably, there are sites of up to 50% stenosis within the P2 segment. Skeleton: Spondylosis at the cervical and visualized upper thoracic levels. Facet joint ankylosis on the right at C4-C5. No acute fracture or aggressive osseous lesion. Other neck: No neck mass or cervical lymphadenopathy. Upper chest: No consolidation within the imaged lung apices. Prior median sternotomy. Review of the MIP images confirms the above findings CTA HEAD FINDINGS Anterior circulation: The intracranial internal carotid arteries are patent. Atherosclerotic plaque within both vessels. Mild to moderate stenosis within  the cavernous segments, bilaterally. The M1 middle cerebral arteries are patent. Atherosclerotic irregularity of the M2 and more distal MCA vessels, bilaterally. No M2 proximal branch occlusion or high-grade proximal stenosis is identified. The anterior cerebral arteries are patent. No intracranial aneurysm is identified. Posterior circulation: The intracranial vertebral arteries are patent. Severe stenosis within the right vertebral artery at the V3/V4 junction. Calcified atherosclerotic plaque within the proximal V4 left vertebral artery with mild-to-moderate stenosis. The basilar  artery is patent. The posterior cerebral arteries are patent. Posterior communicating arteries are diminutive or absent, bilaterally. Venous sinuses: Within the limitations of contrast timing, no convincing thrombus. Anatomic variants: As described Review of the MIP images confirms the above findings These results were communicated to Dr. Leonel Ramsay at 7:39 pmon 11/3/2023by text page via the [facility] messaging system. IMPRESSION: CTA neck: 1. The common carotid and internal carotid arteries are patent within the neck. Atherosclerotic plaque, bilaterally. Most notably, there is an estimated 70-80% stenosis within the proximal right ICA and estimated 70% stenosis within the proximal left ICA. 2. Vertebral arteries patent within the neck. Atherosclerotic plaque, bilaterally. Most notably, there are sites of severe atherosclerotic narrowing within the right vertebral artery at its origin and at the V3/V4 junction. Sites of up to 50% stenosis within the left V2 segment. 3.  Aortic Atherosclerosis (ICD10-I70.0). CTA head: 1. No intracranial large vessel occlusion is identified. 2. Intracranial atherosclerotic disease with multifocal stenoses, most notably as follows. 3. Severe stenosis within the right vertebral artery at the V3/V4 junction. 4. Mild-to-moderate stenoses within the bilateral cavernous internal carotid arteries, and within the  left vertebral artery V4 segment. Electronically Signed   By: Kellie Simmering D.O.   On: 10/30/2022 19:40   CT HEAD CODE STROKE WO CONTRAST  Result Date: 10/30/2022 CLINICAL DATA:  Code stroke. Provided history: Neuro deficit, acute, stroke suspected. Additional history provided: Slurred speech. Last known well 3 p.m. EXAM: CT HEAD WITHOUT CONTRAST TECHNIQUE: Contiguous axial images were obtained from the base of the skull through the vertex without intravenous contrast. RADIATION DOSE REDUCTION: This exam was performed according to the departmental dose-optimization program which includes automated exposure control, adjustment of the mA and/or kV according to patient size and/or use of iterative reconstruction technique. COMPARISON:  Head CT 05/17/2022. FINDINGS: Brain: No age advanced or lobar predominant cerebral atrophy. Mild cerebellar atrophy. Cavum septum pellucidum and cavum vergae (anatomic variant). There is no acute intracranial hemorrhage. No demarcated cortical infarct. No extra-axial fluid collection. No evidence of an intracranial mass. No midline shift. Vascular: No hyperdense vessel. Atherosclerotic calcifications. Skull: No fracture or aggressive osseous lesion. Sinuses/Orbits: No mass or acute finding within the imaged orbits. Minimal mucosal thickening within the bilateral ethmoid and maxillary sinuses. ASPECTS ([place] Stroke Program Early CT Score) - Ganglionic level infarction (caudate, lentiform nuclei, internal capsule, insula, M1-M3 cortex): 7 - Supraganglionic infarction (M4-M6 cortex): 3 Total score (0-10 with 10 being normal): 10 These results were communicated to Dr. Leonel Ramsay at 6:52 pmon 11/3/2023by text page via the [facility] messaging system. IMPRESSION: No evidence of acute intracranial abnormality. Mild cerebellar atrophy. Minimal mucosal thickening within the bilateral ethmoid and maxillary sinuses. Electronically Signed   By: Kellie Simmering D.O.   On: 10/30/2022 18:53        Seriah Brotzman M.D. [facility] 10/31/2022, 11:06 AM  Available via Epic secure chat 7am-7pm After 7 pm, please refer to night coverage provider listed on amion.

## 2022-10-31 NOTE — Progress Notes (Signed)
  Transition of Care (TOC) Screening Note   Patient Details  Name: Bob Miller Date of Birth: September 10, 1951   Transition [facility]) CM/SW Contact:    Harriet Masson, RN Phone Number: 10/31/2022, 5:23 PM    Transition of Care Department [facility]) has reviewed patient and no TOC needs have been identified at this time. We will continue to monitor patient advancement through interdisciplinary progression rounds. If new patient transition needs arise, please place a TOC consult.

## 2022-10-31 NOTE — Progress Notes (Signed)
OT Cancellation Note  Patient Details Name: Bob Miller MRN: [id number] DOB: [birth year]   Cancelled Treatment:    Reason Eval/Treat Not Completed: Pt.'s chart was reviewed, and the prior level of function DME information was obtained. Pt. Received a phone call during the visit, after which Pt. became upset/agitated expressing multiple concerns including the timing of an admissions billing text message. The unit charge nurse was notified. Pt. Reports that his symptoms resolved after about an hour. Pt. Reports being back to his baseline. OT skilled services are not indicated, or warranted at this time.   Harrel Carina, MS, OTR/L 10/31/2022, 12:10 PM

## 2022-10-31 NOTE — Plan of Care (Signed)
Problem: Education: Goal: Ability to describe self-care measures that may prevent or decrease complications (Diabetes Survival Skills Education) will improve 10/31/2022 1637 by Mancel Bale, RN Outcome: Adequate for Discharge 10/31/2022 0945 by Mancel Bale, RN Outcome: Progressing Goal: Individualized Educational Video(s) 10/31/2022 1637 by Mancel Bale, RN Outcome: Adequate for Discharge 10/31/2022 0945 by Mancel Bale, RN Outcome: Progressing   Problem: Coping: Goal: Ability to adjust to condition or change in health will improve 10/31/2022 1637 by Mancel Bale, RN Outcome: Adequate for Discharge 10/31/2022 0945 by Mancel Bale, RN Outcome: Progressing   Problem: Fluid Volume: Goal: Ability to maintain a balanced intake and output will improve 10/31/2022 1637 by Mancel Bale, RN Outcome: Adequate for Discharge 10/31/2022 0945 by Mancel Bale, RN Outcome: Progressing   Problem: Health Behavior/Discharge Planning: Goal: Ability to identify and utilize available resources and services will improve 10/31/2022 1637 by Mancel Bale, RN Outcome: Adequate for Discharge 10/31/2022 0945 by Mancel Bale, RN Outcome: Progressing Goal: Ability to manage health-related needs will improve 10/31/2022 1637 by Mancel Bale, RN Outcome: Adequate for Discharge 10/31/2022 0945 by Mancel Bale, RN Outcome: Progressing   Problem: Metabolic: Goal: Ability to maintain appropriate glucose levels will improve 10/31/2022 1637 by Mancel Bale, RN Outcome: Adequate for Discharge 10/31/2022 0945 by Mancel Bale, RN Outcome: Progressing   Problem: Nutritional: Goal: Maintenance of adequate nutrition will improve 10/31/2022 1637 by Mancel Bale, RN Outcome: Adequate for Discharge 10/31/2022 0945 by Mancel Bale, RN Outcome: Progressing Goal: Progress toward achieving an optimal weight will improve 10/31/2022 1637 by Mancel Bale, RN Outcome:  Adequate for Discharge 10/31/2022 0945 by Mancel Bale, RN Outcome: Progressing   Problem: Skin Integrity: Goal: Risk for impaired skin integrity will decrease 10/31/2022 1637 by Mancel Bale, RN Outcome: Adequate for Discharge 10/31/2022 0945 by Mancel Bale, RN Outcome: Progressing   Problem: Tissue Perfusion: Goal: Adequacy of tissue perfusion will improve 10/31/2022 1637 by Mancel Bale, RN Outcome: Adequate for Discharge 10/31/2022 0945 by Mancel Bale, RN Outcome: Progressing   Problem: Education: Goal: Knowledge of disease or condition will improve 10/31/2022 1637 by Mancel Bale, RN Outcome: Adequate for Discharge 10/31/2022 0945 by Mancel Bale, RN Outcome: Progressing Goal: Knowledge of secondary prevention will improve (MUST DOCUMENT ALL) 10/31/2022 1637 by Mancel Bale, RN Outcome: Adequate for Discharge 10/31/2022 0945 by Mancel Bale, RN Outcome: Progressing Goal: Knowledge of patient specific risk factors will improve Elta [place] N/A or DELETE if not current risk factor) 10/31/2022 1637 by Mancel Bale, RN Outcome: Adequate for Discharge 10/31/2022 0945 by Mancel Bale, RN Outcome: Progressing   Problem: Ischemic Stroke/TIA Tissue Perfusion: Goal: Complications of ischemic stroke/TIA will be minimized 10/31/2022 1637 by Mancel Bale, RN Outcome: Adequate for Discharge 10/31/2022 0945 by Mancel Bale, RN Outcome: Progressing   Problem: Coping: Goal: Will verbalize positive feelings about self 10/31/2022 1637 by Mancel Bale, RN Outcome: Adequate for Discharge 10/31/2022 0945 by Mancel Bale, RN Outcome: Progressing Goal: Will identify appropriate support needs 10/31/2022 1637 by Mancel Bale, RN Outcome: Adequate for Discharge 10/31/2022 0945 by Mancel Bale, RN Outcome: Progressing   Problem: Health Behavior/Discharge Planning: Goal: Ability to manage health-related needs will improve 10/31/2022 1637 by  Mancel Bale, RN Outcome: Adequate for Discharge 10/31/2022 0945 by Mancel Bale, RN Outcome: Progressing Goal: Goals will be collaboratively established with patient/family 10/31/2022 1637 by Isa Rankin,  Rebecka Apley, RN Outcome: Adequate for Discharge 10/31/2022 0945 by Mancel Bale, RN Outcome: Progressing   Problem: Self-Care: Goal: Ability to participate in self-care as condition permits will improve 10/31/2022 1637 by Mancel Bale, RN Outcome: Adequate for Discharge 10/31/2022 0945 by Mancel Bale, RN Outcome: Progressing Goal: Verbalization of feelings and concerns over difficulty with self-care will improve 10/31/2022 1637 by Mancel Bale, RN Outcome: Adequate for Discharge 10/31/2022 0945 by Mancel Bale, RN Outcome: Progressing Goal: Ability to communicate needs accurately will improve 10/31/2022 1637 by Mancel Bale, RN Outcome: Adequate for Discharge 10/31/2022 0945 by Mancel Bale, RN Outcome: Progressing   Problem: Nutrition: Goal: Risk of aspiration will decrease 10/31/2022 1637 by Mancel Bale, RN Outcome: Adequate for Discharge 10/31/2022 0945 by Mancel Bale, RN Outcome: Progressing Goal: Dietary intake will improve 10/31/2022 1637 by Mancel Bale, RN Outcome: Adequate for Discharge 10/31/2022 0945 by Mancel Bale, RN Outcome: Progressing   Problem: Education: Goal: Knowledge of General Education information will improve Description: Including pain rating scale, medication(s)/side effects and non-pharmacologic comfort measures 10/31/2022 1637 by Mancel Bale, RN Outcome: Adequate for Discharge 10/31/2022 0945 by Mancel Bale, RN Outcome: Progressing   Problem: Health Behavior/Discharge Planning: Goal: Ability to manage health-related needs will improve 10/31/2022 1637 by Mancel Bale, RN Outcome: Adequate for Discharge 10/31/2022 0945 by Mancel Bale, RN Outcome: Progressing   Problem: Clinical  Measurements: Goal: Ability to maintain clinical measurements within normal limits will improve 10/31/2022 1637 by Mancel Bale, RN Outcome: Adequate for Discharge 10/31/2022 0945 by Mancel Bale, RN Outcome: Progressing Goal: Will remain free from infection 10/31/2022 1637 by Mancel Bale, RN Outcome: Adequate for Discharge 10/31/2022 0945 by Mancel Bale, RN Outcome: Progressing Goal: Diagnostic test results will improve 10/31/2022 1637 by Mancel Bale, RN Outcome: Adequate for Discharge 10/31/2022 0945 by Mancel Bale, RN Outcome: Progressing Goal: Respiratory complications will improve 10/31/2022 1637 by Mancel Bale, RN Outcome: Adequate for Discharge 10/31/2022 0945 by Mancel Bale, RN Outcome: Progressing Goal: Cardiovascular complication will be avoided 10/31/2022 1637 by Mancel Bale, RN Outcome: Adequate for Discharge 10/31/2022 0945 by Mancel Bale, RN Outcome: Progressing   Problem: Activity: Goal: Risk for activity intolerance will decrease 10/31/2022 1637 by Mancel Bale, RN Outcome: Adequate for Discharge 10/31/2022 0945 by Mancel Bale, RN Outcome: Progressing   Problem: Nutrition: Goal: Adequate nutrition will be maintained 10/31/2022 1637 by Mancel Bale, RN Outcome: Adequate for Discharge 10/31/2022 0945 by Mancel Bale, RN Outcome: Progressing   Problem: Coping: Goal: Level of anxiety will decrease 10/31/2022 1637 by Mancel Bale, RN Outcome: Adequate for Discharge 10/31/2022 0945 by Mancel Bale, RN Outcome: Progressing   Problem: Elimination: Goal: Will not experience complications related to bowel motility 10/31/2022 1637 by Mancel Bale, RN Outcome: Adequate for Discharge 10/31/2022 0945 by Mancel Bale, RN Outcome: Progressing Goal: Will not experience complications related to urinary retention 10/31/2022 1637 by Mancel Bale, RN Outcome: Adequate for Discharge 10/31/2022 0945 by  Mancel Bale, RN Outcome: Progressing   Problem: Pain Managment: Goal: General experience of comfort will improve 10/31/2022 1637 by Mancel Bale, RN Outcome: Adequate for Discharge 10/31/2022 0945 by Mancel Bale, RN Outcome: Progressing   Problem: Safety: Goal: Ability to remain free from injury will improve 10/31/2022 1637 by Mancel Bale, RN Outcome: Adequate for Discharge 10/31/2022 0945 by Mancel Bale, RN  Outcome: Progressing   Problem: Skin Integrity: Goal: Risk for impaired skin integrity will decrease 10/31/2022 1637 by Mancel Bale, RN Outcome: Adequate for Discharge 10/31/2022 0945 by Mancel Bale, RN Outcome: Progressing

## 2022-10-31 NOTE — Progress Notes (Signed)
SLP Cancellation Note  Patient Details Name: Bob Miller MRN: [id number] DOB: Apr 07, 1951   Cancelled treatment:       Reason Eval/Treat Not Completed: SLP screened, no needs identified, will sign off   Bayler Nehring 10/31/2022, 11:38 AM

## 2022-10-31 NOTE — Progress Notes (Signed)
Pt discharged home with family. PIV and tele removed. Discharge instructions provided to patient, verbalized understanding. Pt refused wheelchair and wanted to walk to lobby with family.

## 2022-11-02 LAB — GLUCOSE, CAPILLARY: Glucose-Capillary: 230 mg/dL — ABNORMAL HIGH (ref 70–99)

## 2022-12-03 ENCOUNTER — Other Ambulatory Visit: Payer: Self-pay | Admitting: Neurology

## 2022-12-03 DIAGNOSIS — G459 Transient cerebral ischemic attack, unspecified: Secondary | ICD-10-CM

## 2022-12-04 ENCOUNTER — Encounter: Payer: Self-pay | Admitting: Radiology

## 2022-12-08 ENCOUNTER — Encounter (INDEPENDENT_AMBULATORY_CARE_PROVIDER_SITE_OTHER): Payer: Self-pay | Admitting: Vascular Surgery

## 2022-12-08 ENCOUNTER — Ambulatory Visit (INDEPENDENT_AMBULATORY_CARE_PROVIDER_SITE_OTHER): Payer: Medicare Other | Admitting: Vascular Surgery

## 2022-12-08 VITALS — BP 145/72 | HR 80 | Resp 16 | Wt 240.0 lb

## 2022-12-08 DIAGNOSIS — I6529 Occlusion and stenosis of unspecified carotid artery: Secondary | ICD-10-CM | POA: Insufficient documentation

## 2022-12-08 DIAGNOSIS — I6523 Occlusion and stenosis of bilateral carotid arteries: Secondary | ICD-10-CM

## 2022-12-08 DIAGNOSIS — I25810 Atherosclerosis of coronary artery bypass graft(s) without angina pectoris: Secondary | ICD-10-CM

## 2022-12-08 DIAGNOSIS — E1159 Type 2 diabetes mellitus with other circulatory complications: Secondary | ICD-10-CM | POA: Diagnosis not present

## 2022-12-08 DIAGNOSIS — I152 Hypertension secondary to endocrine disorders: Secondary | ICD-10-CM

## 2022-12-08 NOTE — Assessment & Plan Note (Signed)
He underwent a CT angiogram of the neck which I have independently reviewed.  I would generally agree with the official report which was of a 70% or so of left ICA stenosis and a 70 to 80% right ICA stenosis.  Both are at least moderately calcific.  Appears to be mild tortuosity.   Given these findings and his recent symptoms, he would clearly benefit from intervention.  His symptoms were most likely on the left side given the speech issues of the left carotid to be treated first.  We would have to stage the repairs about 6 to 8 weeks before doing the right side.  We discussed carotid endarterectomy versus carotid stenting.  We had a long discussion today regarding the treatment modalities and the pros and cons of each option.  Given his anatomy, I think it can be done either way.  He would prefer carotid stenting which seems very reasonable.  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  Risk and benefits were discussed with the patient voices understanding and is agreeable to proceed.
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
## 2022-12-08 NOTE — Patient Instructions (Signed)
Carotid Angioplasty With Stent, Care After The following information offers guidance on how to care for yourself after your procedure. Your doctor may also give you more specific instructions. If you have problems or questions, contact your doctor. What can I expect after the procedure? After the procedure, it is common to have: Bruising at the site where the small, thin tube (catheter) was inserted. This usually goes away within 1-2 weeks. A small amount of blood or clear fluid coming from your cut from surgery (incision). A small amount of blood collecting under your skin (hematoma) around the tube site. This may form a lump that can be sore and tender. This usually lasts for 1-2 weeks. You may have a test that uses sound waves to take pictures (ultrasound) of the carotid artery. This ultrasound may be compared to future ultrasounds to check for changes in the artery. Follow these instructions at home: Medicines Take over-the-counter and prescription medicines only as told by your doctor. Your doctor may prescribe blood thinners after the procedure to prevent blood clots. If you are taking blood thinners: Talk with your doctor before taking any medicines that have aspirin or NSAIDs, such as ibuprofen. Take medicines exactly as told. Take them at the same time each day. Avoid doing things that could hurt or bruise you. Take action to prevent falls. Wear an alert bracelet or carry a card that shows you are taking blood thinners. Incision care  Follow instructions from your doctor about how to take care of your cut from surgery. Make sure you: Wash your hands with soap and water for at least 20 seconds before and after you change your bandage (dressing). If you cannot use soap and water, use hand sanitizer. Change your bandage. Leave stitches or skin glue in place for at least 2 weeks. Leave tape strips alone unless you are told to take them off. You may trim the edges of the tape strips if  they curl up. Do not rub the site. This may cause bleeding. Keep your cut from surgery clean and dry. Check the area around your cut every day for signs of infection. Check for: More redness, swelling, or pain. More fluid or blood. Warmth. Pus or a bad smell. Activity Return to your normal activities when your doctor says that it is safe. You may have to avoid lifting. Ask your doctor how much you can safely lift. Avoid sex until your doctor says that this is safe for you. Eating and drinking Follow instructions from your doctor about what you may eat and drink. Drink enough fluid to keep your pee (urine) pale yellow. General instructions Avoid straining to poop to keep the cut from surgery from bleeding. Do not smoke or use any products that contain nicotine or tobacco. If you need help quitting, ask your doctor. Tell all your doctors that you have a stent. Keep all follow-up visits. This includes ultrasound appointments to check for any changes in the artery. Contact a doctor if: You have any infection signs. You have a lump caused by bleeding under your skin, and the lump does not go away after 2 weeks. You have a fever. Get help right away if: You have a hard time breathing. You have pain in your chest. You have a lump caused by bleeding under your skin, and the lump gets bigger quickly. Your cut from surgery starts to bleed and does not stop after you hold pressure on it for a few minutes. You have pain in the area   where your stent was placed. You have any signs of a stroke. You may be at risk for a stroke even after this procedure. You may be at greater risk of a stroke if you have diabetes, lung disease, kidney disease, had a previous stroke, or are 80 years of age or older. "BE FAST" is an easy way to remember the main warning signs: B - Balance. Signs are dizziness, sudden trouble walking, or loss of balance. E - Eyes. Signs are trouble seeing or a change in how you see. F -  Face. Signs are sudden weakness or loss of feeling of the face, or the face or eyelid drooping on one side. A - Arms. Signs are weakness or loss of feeling in an arm. This happens suddenly and usually on one side of the body. S - Speech. Signs are sudden trouble speaking, slurred speech, or trouble understanding what people say. T - Time. Time to call emergency services. Write down what time symptoms started. You have other signs of a stroke, such as: A sudden, very bad headache with no known cause. Feeling like you may vomit (nausea). Vomiting. A seizure. These symptoms may be an emergency. Get help right away. Call 911. Do not wait to see if the symptoms will go away. Do not drive yourself to the hospital. This information is not intended to replace advice given to you by your health care provider. Make sure you discuss any questions you have with your health care provider. Document Revised: 05/12/2022 Document Reviewed: 05/12/2022 Elsevier Patient Education  2023 [employer]  

## 2022-12-08 NOTE — Assessment & Plan Note (Signed)
Has had multiple previous interventions and coronary bypass surgery almost 3 decades ago.

## 2022-12-08 NOTE — Assessment & Plan Note (Signed)
blood glucose control important in reducing the progression of atherosclerotic disease. Also, involved in wound healing. On appropriate medications.  

## 2022-12-08 NOTE — Assessment & Plan Note (Signed)
blood pressure control important in reducing the progression of atherosclerotic disease. On appropriate oral medications.  

## 2022-12-09 ENCOUNTER — Telehealth (INDEPENDENT_AMBULATORY_CARE_PROVIDER_SITE_OTHER): Payer: Self-pay

## 2022-12-09 NOTE — Telephone Encounter (Signed)
Spoke with the patient and he is scheduled with Dr. Lucky Cowboy for a left carotid stent placement on 12/16/22 with a 9:30 am [facility]. Pre-procedure instructions were discussed and will be mailed.

## 2022-12-15 NOTE — Telephone Encounter (Signed)
Patient's procedure day has been rescheduled to 12/17/22 with a 6:45 am [facility] with Dr. Lucky Cowboy. Patient understood and called when his letter arrived and we went over it again with the right day and time.

## 2022-12-16 DIAGNOSIS — I6522 Occlusion and stenosis of left carotid artery: Secondary | ICD-10-CM

## 2022-12-17 ENCOUNTER — Encounter
Admission: RE | Disposition: A | Discharge status: Home / Self Care | Payer: Self-pay | Source: Home / Self Care | Attending: Vascular Surgery

## 2022-12-17 ENCOUNTER — Inpatient Hospital Stay
Admission: RE | Admit: 2022-12-17 | Discharge: 2022-12-18 | DRG: 036 | Disposition: A | Discharge status: Home / Self Care | Payer: Medicare Other | Attending: Vascular Surgery | Admitting: Vascular Surgery

## 2022-12-17 ENCOUNTER — Encounter: Payer: Self-pay | Admitting: Vascular Surgery

## 2022-12-17 DIAGNOSIS — I6522 Occlusion and stenosis of left carotid artery: Secondary | ICD-10-CM | POA: Diagnosis present

## 2022-12-17 DIAGNOSIS — Q2549 Other congenital malformations of aorta: Secondary | ICD-10-CM | POA: Diagnosis not present

## 2022-12-17 DIAGNOSIS — Z7902 Long term (current) use of antithrombotics/antiplatelets: Secondary | ICD-10-CM

## 2022-12-17 DIAGNOSIS — E1169 Type 2 diabetes mellitus with other specified complication: Secondary | ICD-10-CM | POA: Diagnosis present

## 2022-12-17 DIAGNOSIS — E669 Obesity, unspecified: Secondary | ICD-10-CM | POA: Diagnosis present

## 2022-12-17 DIAGNOSIS — Z951 Presence of aortocoronary bypass graft: Secondary | ICD-10-CM | POA: Diagnosis not present

## 2022-12-17 DIAGNOSIS — I6523 Occlusion and stenosis of bilateral carotid arteries: Secondary | ICD-10-CM | POA: Diagnosis not present

## 2022-12-17 DIAGNOSIS — I639 Cerebral infarction, unspecified: Secondary | ICD-10-CM

## 2022-12-17 DIAGNOSIS — E1122 Type 2 diabetes mellitus with diabetic chronic kidney disease: Secondary | ICD-10-CM | POA: Diagnosis present

## 2022-12-17 DIAGNOSIS — Z7984 Long term (current) use of oral hypoglycemic drugs: Secondary | ICD-10-CM

## 2022-12-17 DIAGNOSIS — Z885 Allergy status to narcotic agent status: Secondary | ICD-10-CM

## 2022-12-17 DIAGNOSIS — E1159 Type 2 diabetes mellitus with other circulatory complications: Secondary | ICD-10-CM | POA: Diagnosis present

## 2022-12-17 DIAGNOSIS — I63239 Cerebral infarction due to unspecified occlusion or stenosis of unspecified carotid arteries: Principal | ICD-10-CM | POA: Diagnosis present

## 2022-12-17 DIAGNOSIS — Z6834 Body mass index (BMI) 34.0-34.9, adult: Secondary | ICD-10-CM | POA: Diagnosis not present

## 2022-12-17 DIAGNOSIS — Z888 Allergy status to other drugs, medicaments and biological substances status: Secondary | ICD-10-CM

## 2022-12-17 DIAGNOSIS — Z7982 Long term (current) use of aspirin: Secondary | ICD-10-CM

## 2022-12-17 DIAGNOSIS — I2510 Atherosclerotic heart disease of native coronary artery without angina pectoris: Secondary | ICD-10-CM | POA: Diagnosis present

## 2022-12-17 DIAGNOSIS — E785 Hyperlipidemia, unspecified: Secondary | ICD-10-CM | POA: Diagnosis present

## 2022-12-17 DIAGNOSIS — Z79899 Other long term (current) drug therapy: Secondary | ICD-10-CM

## 2022-12-17 DIAGNOSIS — I152 Hypertension secondary to endocrine disorders: Secondary | ICD-10-CM | POA: Diagnosis present

## 2022-12-17 DIAGNOSIS — Z833 Family history of diabetes mellitus: Secondary | ICD-10-CM | POA: Diagnosis not present

## 2022-12-17 DIAGNOSIS — N182 Chronic kidney disease, stage 2 (mild): Secondary | ICD-10-CM | POA: Diagnosis present

## 2022-12-17 DIAGNOSIS — Z8673 Personal history of transient ischemic attack (TIA), and cerebral infarction without residual deficits: Secondary | ICD-10-CM

## 2022-12-17 DIAGNOSIS — Z794 Long term (current) use of insulin: Secondary | ICD-10-CM

## 2022-12-17 HISTORY — PX: CAROTID PTA/STENT INTERVENTION: CATH118231

## 2022-12-17 LAB — MRSA NEXT GEN BY PCR, NASAL: MRSA by PCR Next Gen: NOT DETECTED

## 2022-12-17 LAB — GLUCOSE, CAPILLARY
Glucose-Capillary: 320 mg/dL — ABNORMAL HIGH (ref 70–99)
Glucose-Capillary: 296 mg/dL — ABNORMAL HIGH (ref 70–99)
Glucose-Capillary: 422 mg/dL — ABNORMAL HIGH (ref 70–99)
Glucose-Capillary: 376 mg/dL — ABNORMAL HIGH (ref 70–99)
Glucose-Capillary: 280 mg/dL — ABNORMAL HIGH (ref 70–99)

## 2022-12-17 LAB — CREATININE, SERUM
Creatinine, Ser: 1.61 mg/dL — ABNORMAL HIGH (ref 0.61–1.24)
GFR, Estimated: 46 mL/min — ABNORMAL LOW (ref >60)

## 2022-12-17 LAB — BUN: BUN: 21 mg/dL (ref 8–23)

## 2022-12-17 LAB — POCT ACTIVATED CLOTTING TIME: Activated Clotting Time: 244 seconds

## 2022-12-17 SURGERY — CAROTID PTA/STENT INTERVENTION
Anesthesia: Moderate Sedation | Laterality: Left

## 2022-12-17 MED ORDER — INSULIN ASPART 100 UNIT/ML IJ SOLN: 6.0000 [IU] | Freq: Once | INTRAMUSCULAR | Status: AC

## 2022-12-17 MED ORDER — MIDAZOLAM HCL 2 MG/2ML IJ SOLN
INTRAMUSCULAR | Status: AC
  Filled 2022-12-17: qty 2

## 2022-12-17 MED ORDER — VITAMIN D (ERGOCALCIFEROL) 1.25 MG (50000 UNIT) PO CAPS: 50000.0000 [IU] | ORAL_CAPSULE | ORAL | Status: DC

## 2022-12-17 MED ORDER — ALUM & MAG HYDROXIDE-SIMETH 200-200-20 MG/5ML PO SUSP: 15.0000 mL | ORAL | Status: DC | PRN

## 2022-12-17 MED ORDER — DOXAZOSIN MESYLATE 2 MG PO TABS
2.0000 mg | ORAL_TABLET | Freq: Every day | ORAL | Status: DC
  Filled 2022-12-17: qty 1

## 2022-12-17 MED ORDER — SODIUM CHLORIDE 0.9 % IV SOLN: INTRAVENOUS | Status: DC

## 2022-12-17 MED ORDER — AMLODIPINE BESYLATE 5 MG PO TABS: 10.0000 mg | ORAL_TABLET | Freq: Every day | ORAL | Status: DC

## 2022-12-17 MED ORDER — MIDAZOLAM HCL 2 MG/ML PO SYRP: 8.0000 mg | ORAL_SOLUTION | Freq: Once | ORAL | Status: DC | PRN

## 2022-12-17 MED ORDER — ROSUVASTATIN CALCIUM 20 MG PO TABS
20.0000 mg | ORAL_TABLET | Freq: Every day | ORAL | Status: DC
  Administered 2022-12-18: 20 mg via ORAL
  Filled 2022-12-17 (×2): qty 1

## 2022-12-17 MED ORDER — METHYLPREDNISOLONE SODIUM SUCC 125 MG IJ SOLR: 125.0000 mg | Freq: Once | INTRAMUSCULAR | Status: DC | PRN

## 2022-12-17 MED ORDER — OXYCODONE-ACETAMINOPHEN 5-325 MG PO TABS
1.0000 | ORAL_TABLET | ORAL | Status: DC | PRN
  Administered 2022-12-18: 1 via ORAL
  Filled 2022-12-17: qty 1

## 2022-12-17 MED ORDER — HEPARIN SODIUM (PORCINE) 1000 UNIT/ML IJ SOLN
INTRAMUSCULAR | Status: AC
  Filled 2022-12-17: qty 10

## 2022-12-17 MED ORDER — FAMOTIDINE 20 MG PO TABS: 40.0000 mg | ORAL_TABLET | Freq: Once | ORAL | Status: DC | PRN

## 2022-12-17 MED ORDER — ATROPINE SULFATE 1 MG/10ML IJ SOSY
PREFILLED_SYRINGE | INTRAMUSCULAR | Status: DC | PRN
  Administered 2022-12-17: 1 mg via INTRAVENOUS

## 2022-12-17 MED ORDER — MORPHINE SULFATE (PF) 4 MG/ML IV SOLN: 2.0000 mg | INTRAVENOUS | Status: DC | PRN

## 2022-12-17 MED ORDER — INSULIN ASPART 100 UNIT/ML IJ SOLN
INTRAMUSCULAR | Status: AC
  Administered 2022-12-17: 6 [IU] via SUBCUTANEOUS
  Filled 2022-12-17: qty 1

## 2022-12-17 MED ORDER — FENTANYL CITRATE PF 50 MCG/ML IJ SOSY
PREFILLED_SYRINGE | INTRAMUSCULAR | Status: AC
  Filled 2022-12-17: qty 1

## 2022-12-17 MED ORDER — ASPIRIN 81 MG PO TBEC
81.0000 mg | DELAYED_RELEASE_TABLET | Freq: Every day | ORAL | Status: DC
  Administered 2022-12-17 – 2022-12-18 (×2): 81 mg via ORAL
  Filled 2022-12-17 (×2): qty 1

## 2022-12-17 MED ORDER — MIDAZOLAM HCL 2 MG/2ML IJ SOLN
INTRAMUSCULAR | Status: DC | PRN
  Administered 2022-12-17: 2 mg via INTRAVENOUS
  Administered 2022-12-17: 1 mg via INTRAVENOUS

## 2022-12-17 MED ORDER — ONDANSETRON HCL 4 MG/2ML IJ SOLN: 4.0000 mg | Freq: Four times a day (QID) | INTRAMUSCULAR | Status: DC | PRN

## 2022-12-17 MED ORDER — METOPROLOL TARTRATE 50 MG PO TABS: 50.0000 mg | ORAL_TABLET | Freq: Two times a day (BID) | ORAL | Status: DC

## 2022-12-17 MED ORDER — LABETALOL HCL 5 MG/ML IV SOLN: 10.0000 mg | INTRAVENOUS | Status: DC | PRN

## 2022-12-17 MED ORDER — METOPROLOL TARTRATE 5 MG/5ML IV SOLN: 2.0000 mg | INTRAVENOUS | Status: DC | PRN

## 2022-12-17 MED ORDER — ATROPINE SULFATE 1 MG/10ML IJ SOSY
PREFILLED_SYRINGE | INTRAMUSCULAR | Status: AC
  Filled 2022-12-17: qty 10

## 2022-12-17 MED ORDER — HYDROMORPHONE HCL 1 MG/ML IJ SOLN: 1.0000 mg | Freq: Once | INTRAMUSCULAR | Status: DC | PRN

## 2022-12-17 MED ORDER — ACETAMINOPHEN 325 MG PO TABS: 325.0000 mg | ORAL_TABLET | ORAL | Status: DC | PRN

## 2022-12-17 MED ORDER — CHLORHEXIDINE GLUCONATE CLOTH 2 % EX PADS
6.0000 | MEDICATED_PAD | Freq: Every day | CUTANEOUS | Status: DC
  Administered 2022-12-17 – 2022-12-18 (×2): 6 via TOPICAL

## 2022-12-17 MED ORDER — CEFAZOLIN SODIUM-DEXTROSE 2-4 GM/100ML-% IV SOLN
INTRAVENOUS | Status: AC
  Filled 2022-12-17: qty 100

## 2022-12-17 MED ORDER — INSULIN ASPART 100 UNIT/ML IJ SOLN
INTRAMUSCULAR | Status: AC
  Filled 2022-12-17: qty 1

## 2022-12-17 MED ORDER — DIPHENHYDRAMINE HCL 50 MG/ML IJ SOLN: 50.0000 mg | Freq: Once | INTRAMUSCULAR | Status: DC | PRN

## 2022-12-17 MED ORDER — INSULIN ASPART 100 UNIT/ML IJ SOLN
4.0000 [IU] | Freq: Three times a day (TID) | INTRAMUSCULAR | Status: DC
  Administered 2022-12-18 (×2): 4 [IU] via SUBCUTANEOUS
  Filled 2022-12-17 (×2): qty 1

## 2022-12-17 MED ORDER — INSULIN GLARGINE-YFGN 100 UNIT/ML ~~LOC~~ SOLN
35.0000 [IU] | Freq: Every day | SUBCUTANEOUS | Status: DC
  Administered 2022-12-17: 35 [IU] via SUBCUTANEOUS
  Filled 2022-12-17 (×2): qty 0.35

## 2022-12-17 MED ORDER — GLUCOSAMINE-CHONDROITIN 500-400 MG PO TABS: 1.0000 | ORAL_TABLET | Freq: Every day | ORAL | Status: DC

## 2022-12-17 MED ORDER — POTASSIUM CHLORIDE CRYS ER 20 MEQ PO TBCR: 20.0000 meq | EXTENDED_RELEASE_TABLET | Freq: Every day | ORAL | Status: DC | PRN

## 2022-12-17 MED ORDER — CEFAZOLIN SODIUM-DEXTROSE 2-4 GM/100ML-% IV SOLN
2.0000 g | INTRAVENOUS | Status: AC
  Administered 2022-12-17: 2 g via INTRAVENOUS

## 2022-12-17 MED ORDER — CEFAZOLIN SODIUM-DEXTROSE 2-4 GM/100ML-% IV SOLN
2.0000 g | Freq: Three times a day (TID) | INTRAVENOUS | Status: AC
  Administered 2022-12-17 – 2022-12-18 (×2): 2 g via INTRAVENOUS
  Filled 2022-12-17 (×3): qty 100

## 2022-12-17 MED ORDER — TAMSULOSIN HCL 0.4 MG PO CAPS
0.4000 mg | ORAL_CAPSULE | Freq: Every day | ORAL | Status: DC
  Administered 2022-12-18: 0.4 mg via ORAL
  Filled 2022-12-17: qty 1

## 2022-12-17 MED ORDER — INSULIN ASPART 100 UNIT/ML IJ SOLN
10.0000 [IU] | Freq: Once | INTRAMUSCULAR | Status: DC
  Filled 2022-12-17: qty 1

## 2022-12-17 MED ORDER — FENTANYL CITRATE (PF) 100 MCG/2ML IJ SOLN
INTRAMUSCULAR | Status: DC | PRN
  Administered 2022-12-17: 50 ug via INTRAVENOUS
  Administered 2022-12-17: 25 ug via INTRAVENOUS

## 2022-12-17 MED ORDER — FAMOTIDINE IN NACL 20-0.9 MG/50ML-% IV SOLN
20.0000 mg | Freq: Two times a day (BID) | INTRAVENOUS | Status: DC
  Administered 2022-12-17: 20 mg via INTRAVENOUS
  Filled 2022-12-17 (×2): qty 50

## 2022-12-17 MED ORDER — VITAMIN B-12 1000 MCG PO TABS
1000.0000 ug | ORAL_TABLET | Freq: Every day | ORAL | Status: DC
  Administered 2022-12-18: 1000 ug via ORAL
  Filled 2022-12-17: qty 1

## 2022-12-17 MED ORDER — INSULIN ASPART 100 UNIT/ML IJ SOLN
0.0000 [IU] | Freq: Three times a day (TID) | INTRAMUSCULAR | Status: DC
  Administered 2022-12-17: 15 [IU] via SUBCUTANEOUS
  Administered 2022-12-18: 5 [IU] via SUBCUTANEOUS
  Administered 2022-12-18: 3 [IU] via SUBCUTANEOUS
  Filled 2022-12-17 (×3): qty 1

## 2022-12-17 MED ORDER — LOSARTAN POTASSIUM 50 MG PO TABS: 100.0000 mg | ORAL_TABLET | Freq: Every day | ORAL | Status: DC

## 2022-12-17 MED ORDER — IODIXANOL 320 MG/ML IV SOLN
INTRAVENOUS | Status: DC | PRN
  Administered 2022-12-17: 40 mL via INTRA_ARTERIAL

## 2022-12-17 MED ORDER — PHENOL 1.4 % MT LIQD: 1.0000 | OROMUCOSAL | Status: DC | PRN

## 2022-12-17 MED ORDER — HEPARIN SODIUM (PORCINE) 1000 UNIT/ML IJ SOLN
INTRAMUSCULAR | Status: DC | PRN
  Administered 2022-12-17: 9000 [IU] via INTRAVENOUS

## 2022-12-17 MED ORDER — INSULIN ASPART 100 UNIT/ML IJ SOLN
6.0000 [IU] | Freq: Once | INTRAMUSCULAR | Status: AC
  Administered 2022-12-17: 6 [IU] via SUBCUTANEOUS

## 2022-12-17 MED ORDER — ACETAMINOPHEN 325 MG RE SUPP: 325.0000 mg | RECTAL | Status: DC | PRN

## 2022-12-17 MED ORDER — HYDRALAZINE HCL 20 MG/ML IJ SOLN: 5.0000 mg | INTRAMUSCULAR | Status: DC | PRN

## 2022-12-17 MED ORDER — INSULIN ASPART 100 UNIT/ML IJ SOLN
0.0000 [IU] | Freq: Every day | INTRAMUSCULAR | Status: DC
  Administered 2022-12-17: 3 [IU] via SUBCUTANEOUS
  Filled 2022-12-17: qty 1

## 2022-12-17 MED ORDER — FERROUS SULFATE 325 (65 FE) MG PO TABS
325.0000 mg | ORAL_TABLET | Freq: Every day | ORAL | Status: DC
  Administered 2022-12-18: 325 mg via ORAL
  Filled 2022-12-17: qty 1

## 2022-12-17 MED ORDER — GABAPENTIN 100 MG PO CAPS
100.0000 mg | ORAL_CAPSULE | Freq: Two times a day (BID) | ORAL | Status: DC
  Administered 2022-12-17 – 2022-12-18 (×2): 100 mg via ORAL
  Filled 2022-12-17 (×2): qty 1

## 2022-12-17 MED ORDER — SODIUM CHLORIDE 0.9 % IV SOLN: 500.0000 mL | Freq: Once | INTRAVENOUS | Status: DC | PRN

## 2022-12-17 MED ORDER — MAGNESIUM SULFATE 2 GM/50ML IV SOLN: 2.0000 g | Freq: Every day | INTRAVENOUS | Status: DC | PRN

## 2022-12-17 MED ORDER — PANTOPRAZOLE SODIUM 40 MG PO TBEC
40.0000 mg | DELAYED_RELEASE_TABLET | Freq: Every day | ORAL | Status: DC
  Administered 2022-12-18: 40 mg via ORAL
  Filled 2022-12-17: qty 1

## 2022-12-17 MED ORDER — CLOPIDOGREL BISULFATE 75 MG PO TABS
75.0000 mg | ORAL_TABLET | Freq: Every day | ORAL | Status: DC
  Administered 2022-12-17 – 2022-12-18 (×2): 75 mg via ORAL
  Filled 2022-12-17 (×2): qty 1

## 2022-12-17 MED ORDER — GUAIFENESIN-DM 100-10 MG/5ML PO SYRP: 15.0000 mL | ORAL_SOLUTION | ORAL | Status: DC | PRN

## 2022-12-17 MED ORDER — ISOSORBIDE MONONITRATE ER 60 MG PO TB24
60.0000 mg | ORAL_TABLET | Freq: Every day | ORAL | Status: DC
  Filled 2022-12-17: qty 1

## 2022-12-17 SURGICAL SUPPLY — 18 items
BALLN VIATRAC 5X30X135 (BALLOONS) ×1
BALLOON VIATRAC 5X30X135 (BALLOONS) IMPLANT
CATH ANGIO 5F PIGTAIL 100CM (CATHETERS) IMPLANT
CATH HEADHUNTER H1 5F 100CM (CATHETERS) IMPLANT
COVER DRAPE FLUORO 36X44 (DRAPES) IMPLANT
DEVICE EMBOSHIELD NAV6 4.0-7.0 (FILTER) IMPLANT
DEVICE SAFEGUARD 24CM (GAUZE/BANDAGES/DRESSINGS) IMPLANT
DEVICE STARCLOSE SE CLOSURE (Vascular Products) IMPLANT
DEVICE TORQUE .025-.038 (MISCELLANEOUS) IMPLANT
GLIDEWIRE ANGLED SS 035X260CM (WIRE) IMPLANT
GUIDEWIRE VASC STIFF .038X260 (WIRE) IMPLANT
KIT CAROTID MANIFOLD (MISCELLANEOUS) IMPLANT
KIT ENCORE 26 ADVANTAGE (KITS) IMPLANT
PACK ANGIOGRAPHY (CUSTOM PROCEDURE TRAY) ×1 IMPLANT
SHEATH BRITE TIP 6FRX11 (SHEATH) IMPLANT
SHEATH SHUTTLE SELECT 6F (SHEATH) IMPLANT
STENT XACT CAR 9-7X40X136 (Permanent Stent) IMPLANT
WIRE GUIDERIGHT .035X150 (WIRE) IMPLANT

## 2022-12-17 NOTE — Progress Notes (Signed)
Dr. Lucky Cowboy at bedside, speaking with pt. And his wife re: carotid procedure. Both verbalized understanding of conversation.

## 2022-12-17 NOTE — Interval H&P Note (Signed)
History and Physical Interval Note:  12/17/2022 7:41 AM  Bob Miller  has presented today for surgery, with the diagnosis of L Carotid Stent    ABBOTT  Carotid stenosis.  The various methods of treatment have been discussed with the patient and family. After consideration of risks, benefits and other options for treatment, the patient has consented to  Procedure(s): CAROTID PTA/STENT INTERVENTION (Left) as a surgical intervention.  The patient's history has been reviewed, patient examined, no change in status, stable for surgery.  I have reviewed the patient's chart and labs.  Questions were answered to the patient's satisfaction.     [name]

## 2022-12-17 NOTE — Progress Notes (Signed)
Pre procedure: no neuro deficits. Equal hand grasps, leg lifts. No palmar drift. Smlles & sticks tongue out without any difficulties:. No slurred speech. Pushes and pulls feet with strong effort.

## 2022-12-17 NOTE — Op Note (Signed)
OPERATIVE NOTE DATE: 12/17/2022  PROCEDURE:  Ultrasound guidance for vascular access right femoral artery  Placement of a 9 mm proximal, 7 mm distal, 4 cm long Exact stent with the use of the NAV-6 embolic protection device in the left carotid artery  PRE-OPERATIVE DIAGNOSIS: 1. Bilateral carotid artery stenosis. 2.  Recent left hemispheric stroke  POST-OPERATIVE DIAGNOSIS:  Same as above  SURGEON: [name], MD  ASSISTANT(S): None  ANESTHESIA: local/MCS  ESTIMATED BLOOD LOSS: 25 cc  CONTRAST: 40 cc  FLUORO TIME: 5.6 minutes  MODERATE CONSCIOUS SEDATION TIME:  Approximately 32 minutes using 3 mg of Versed and 75 mcg of Fentanyl  FINDING(S): 1.   Ulcerated left carotid artery stenosis of 60-70%  SPECIMEN(S):   none  INDICATIONS:   Patient is a 71 y.o. male who presents with recent left hemispheric stroke and bilateral carotid artery stenosis.  The patient has reasonably good anatomy for stenting and a litany of medical issues and carotid artery stenting was felt to be preferred to endarterectomy for that reason.  Risks and benefits were discussed and informed consent was obtained.   DESCRIPTION: After obtaining full informed written consent, the patient was brought back to the vascular suite and placed supine upon the table.  The patient received IV antibiotics prior to induction. Moderate conscious sedation was administered during a face to face encounter with the patient throughout the procedure with my supervision of the RN administering medicines and monitoring the patients vital signs and mental status throughout from the start of the procedure until the patient was taken to the recovery room.  After obtaining adequate anesthesia, the patient was prepped and draped in the standard fashion.   The right femoral artery was visualized with ultrasound and found to be widely patent. It was then accessed under direct ultrasound guidance without difficulty with a Seldinger  needle. A permanent image was recorded. A J-wire was placed and we then placed a 6 French sheath. The patient was then heparinized and a total of 9000 units of intravenous heparin were given and an ACT was checked. A pigtail catheter was then placed into the ascending aorta. This showed a bovine configuration of the aortic arch with a type II aortic arch present. I then selectively cannulated the left common carotid artery without difficulty with a headhunter catheter and advanced into the mid left common carotid artery.  Cervical and cerebral carotid angiography was then performed. There were no obvious intracranial filling defects with no significant cross-filling. The carotid bifurcation demonstrated an ulcerative lesion that was in the 60 to 70% range and was moderately calcific and somewhat long.  I then advanced into the external carotid artery with a Glidewire and the headhunter catheter and then exchanged for the Amplatz Super Stiff wire. Over the Amplatz Super Stiff wire, a 6 [place] shuttle sheath was placed into the mid common carotid artery. I then used the NAV-6  Embolic protection device and crossed the lesion and parked this in the distal internal carotid artery at the base of the skull.  I then selected a 9 mm proximal, 7 mm distal, 4 cm long exact stent. This was deployed across the lesion encompassing it in its entirety. A 5 mm diameter by 3 cm length balloon was used to post dilate the stent. Only about a 10-15% residual stenosis was present after angioplasty. Completion angiogram showed normal intracranial filling without new defects. At this point I elected to terminate the procedure. The sheath was removed and StarClose closure  device was deployed in the right femoral artery with excellent hemostatic result. The patient was taken to the recovery room in stable condition having tolerated the procedure well.  COMPLICATIONS: none  CONDITION: stable  [name] 12/17/2022 10:13 AM   This  note was created with Dragon Medical transcription system. Any errors in dictation are purely unintentional.

## 2022-12-17 NOTE — Progress Notes (Signed)
PHARMACIST - PHYSICIAN ORDER COMMUNICATION  CONCERNING: P&T Medication Policy on Herbal Medications  DESCRIPTION:  This patient's order for:  glucosamine-chonroitin  has been noted.  This product(s) is classified as an "herbal" or natural product. Due to a lack of definitive safety studies or FDA approval, nonstandard manufacturing practices, plus the potential risk of unknown drug-drug interactions while on inpatient medications, the Pharmacy and Therapeutics Committee does not permit the use of "herbal" or natural products of this type within [facility].   ACTION TAKEN: The pharmacy department is unable to verify this order at this time and your patient has been informed of this safety policy. Please reevaluate patient's clinical condition at discharge and address if the herbal or natural product(s) should be resumed at that time.

## 2022-12-18 LAB — BASIC METABOLIC PANEL
Anion gap: 6 (ref 5–15)
BUN: 20 mg/dL (ref 8–23)
CO2: 23 mmol/L (ref 22–32)
Calcium: 8.2 mg/dL — ABNORMAL LOW (ref 8.9–10.3)
Chloride: 110 mmol/L (ref 98–111)
Creatinine, Ser: 1.61 mg/dL — ABNORMAL HIGH (ref 0.61–1.24)
GFR, Estimated: 46 mL/min — ABNORMAL LOW (ref >60)
Glucose, Bld: 177 mg/dL — ABNORMAL HIGH (ref 70–99)
Potassium: 3.8 mmol/L (ref 3.5–5.1)
Sodium: 139 mmol/L (ref 135–145)

## 2022-12-18 LAB — CBC
HCT: 35.1 % — ABNORMAL LOW (ref 39.0–52.0)
Hemoglobin: 11.8 g/dL — ABNORMAL LOW (ref 13.0–17.0)
MCH: 28.3 pg (ref 26.0–34.0)
MCHC: 33.6 g/dL (ref 30.0–36.0)
MCV: 84.2 fL (ref 80.0–100.0)
Platelets: 186 10*3/uL (ref 150–400)
RBC: 4.17 MIL/uL — ABNORMAL LOW (ref 4.22–5.81)
RDW: 13.6 % (ref 11.5–15.5)
WBC: 10.2 10*3/uL (ref 4.0–10.5)
nRBC: 0 % (ref 0.0–0.2)

## 2022-12-18 LAB — GLUCOSE, CAPILLARY
Glucose-Capillary: 151 mg/dL — ABNORMAL HIGH (ref 70–99)
Glucose-Capillary: 249 mg/dL — ABNORMAL HIGH (ref 70–99)

## 2022-12-18 MED ORDER — FAMOTIDINE 20 MG PO TABS
20.0000 mg | ORAL_TABLET | Freq: Two times a day (BID) | ORAL | Status: DC
  Administered 2022-12-18: 20 mg via ORAL
  Filled 2022-12-18: qty 1

## 2022-12-18 NOTE — Progress Notes (Signed)
PHARMACIST - PHYSICIAN COMMUNICATION  CONCERNING: IV to Oral Route Change Policy  RECOMMENDATION: This patient is receiving famotidine by the intravenous route.  Based on criteria approved by the Pharmacy and Therapeutics Committee, the intravenous medication(s) is/are being converted to the equivalent oral dose form(s).  DESCRIPTION: These criteria include: The patient is eating (either orally or via tube) and/or has been taking other orally administered medications for a least 24 hours The patient has no evidence of active gastrointestinal bleeding or impaired GI absorption (gastrectomy, short bowel, patient on TNA or NPO).  If you have questions about this conversion, please contact the [place], [facility] 12/18/2022 8:40 AM
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
## 2022-12-18 NOTE — Progress Notes (Signed)
All discharge instructions discussed with pt, and pt's wife at bedside. All questions answered at this time.

## 2022-12-22 ENCOUNTER — Encounter: Payer: Self-pay | Admitting: Vascular Surgery

## 2023-01-19 ENCOUNTER — Other Ambulatory Visit: Payer: Self-pay | Admitting: Urology

## 2023-01-26 ENCOUNTER — Other Ambulatory Visit (INDEPENDENT_AMBULATORY_CARE_PROVIDER_SITE_OTHER): Payer: Self-pay | Admitting: Vascular Surgery

## 2023-01-26 DIAGNOSIS — I6523 Occlusion and stenosis of bilateral carotid arteries: Secondary | ICD-10-CM

## 2023-01-27 ENCOUNTER — Ambulatory Visit (INDEPENDENT_AMBULATORY_CARE_PROVIDER_SITE_OTHER): Payer: Medicare Other

## 2023-01-27 ENCOUNTER — Encounter (INDEPENDENT_AMBULATORY_CARE_PROVIDER_SITE_OTHER): Payer: Self-pay | Admitting: Nurse Practitioner

## 2023-01-27 ENCOUNTER — Ambulatory Visit (INDEPENDENT_AMBULATORY_CARE_PROVIDER_SITE_OTHER): Payer: Medicare Other | Admitting: Nurse Practitioner

## 2023-01-27 VITALS — BP 154/74 | HR 76 | Resp 16 | Wt 228.0 lb

## 2023-01-27 DIAGNOSIS — I6523 Occlusion and stenosis of bilateral carotid arteries: Secondary | ICD-10-CM

## 2023-01-27 DIAGNOSIS — I152 Hypertension secondary to endocrine disorders: Secondary | ICD-10-CM

## 2023-01-27 DIAGNOSIS — E1159 Type 2 diabetes mellitus with other circulatory complications: Secondary | ICD-10-CM

## 2023-02-08 ENCOUNTER — Encounter (INDEPENDENT_AMBULATORY_CARE_PROVIDER_SITE_OTHER): Payer: Self-pay | Admitting: Nurse Practitioner
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
## 2023-02-26 ENCOUNTER — Other Ambulatory Visit: Payer: Self-pay | Admitting: Urology

## 2023-03-02 ENCOUNTER — Ambulatory Visit (INDEPENDENT_AMBULATORY_CARE_PROVIDER_SITE_OTHER): Payer: Medicare Other | Admitting: Vascular Surgery

## 2023-03-02 ENCOUNTER — Encounter (INDEPENDENT_AMBULATORY_CARE_PROVIDER_SITE_OTHER): Payer: Self-pay | Admitting: Vascular Surgery

## 2023-03-02 VITALS — BP 138/69 | HR 60 | Resp 16 | Wt 234.8 lb

## 2023-03-02 DIAGNOSIS — I152 Hypertension secondary to endocrine disorders: Secondary | ICD-10-CM

## 2023-03-02 DIAGNOSIS — I63239 Cerebral infarction due to unspecified occlusion or stenosis of unspecified carotid arteries: Secondary | ICD-10-CM

## 2023-03-02 DIAGNOSIS — E1159 Type 2 diabetes mellitus with other circulatory complications: Secondary | ICD-10-CM
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
## 2023-03-02 NOTE — Assessment & Plan Note (Signed)
blood glucose control important in reducing the progression of atherosclerotic disease. Also, involved in wound healing. On appropriate medications.  

## 2023-03-02 NOTE — Assessment & Plan Note (Signed)
blood pressure control important in reducing the progression of atherosclerotic disease. On appropriate oral medications.  

## 2023-03-02 NOTE — Assessment & Plan Note (Signed)
Has known bilateral carotid artery stenosis with previous left hemispheric stroke.  Underwent left carotid stent placement almost 3 months ago.  Now ready to proceed with right carotid artery stent placement.  Risks and benefits were discussed and he is agreeable to proceed.

## 2023-03-03 ENCOUNTER — Telehealth (INDEPENDENT_AMBULATORY_CARE_PROVIDER_SITE_OTHER): Payer: Self-pay

## 2023-03-03 NOTE — Telephone Encounter (Signed)
Spoke with the patient and he is scheduled with Dr. Lucky Cowboy on 03/11/23 with a 11:00 am arrival time to [facility]. Pre-procedure instructions were discussed and will be mailed and sent to [place].

## 2023-03-10 ENCOUNTER — Telehealth (INDEPENDENT_AMBULATORY_CARE_PROVIDER_SITE_OTHER): Payer: Self-pay

## 2023-03-10 NOTE — Telephone Encounter (Signed)
I contacted the patient to have him come in earlier for his procedure with Dr. Lucky Cowboy on 03/11/23. Patient will arrive at [facility] at 6:45 am. Patient understood and was happy with the time change.

## 2023-03-11 ENCOUNTER — Other Ambulatory Visit: Payer: Self-pay

## 2023-03-11 ENCOUNTER — Encounter
Admission: RE | Disposition: A | Discharge status: Home / Self Care | Payer: Self-pay | Source: Home / Self Care | Attending: Vascular Surgery

## 2023-03-11 ENCOUNTER — Inpatient Hospital Stay
Admission: RE | Admit: 2023-03-11 | Discharge: 2023-03-13 | DRG: 036 | Disposition: A | Discharge status: Home / Self Care | Payer: Medicare Other | Attending: Vascular Surgery | Admitting: Vascular Surgery

## 2023-03-11 ENCOUNTER — Encounter: Payer: Self-pay | Admitting: Vascular Surgery

## 2023-03-11 DIAGNOSIS — Z794 Long term (current) use of insulin: Secondary | ICD-10-CM

## 2023-03-11 DIAGNOSIS — Z955 Presence of coronary angioplasty implant and graft: Secondary | ICD-10-CM

## 2023-03-11 DIAGNOSIS — Z7902 Long term (current) use of antithrombotics/antiplatelets: Secondary | ICD-10-CM | POA: Diagnosis not present

## 2023-03-11 DIAGNOSIS — Z79899 Other long term (current) drug therapy: Secondary | ICD-10-CM

## 2023-03-11 DIAGNOSIS — I63239 Cerebral infarction due to unspecified occlusion or stenosis of unspecified carotid arteries: Secondary | ICD-10-CM

## 2023-03-11 DIAGNOSIS — Z888 Allergy status to other drugs, medicaments and biological substances status: Secondary | ICD-10-CM

## 2023-03-11 DIAGNOSIS — Z7982 Long term (current) use of aspirin: Secondary | ICD-10-CM | POA: Diagnosis not present

## 2023-03-11 DIAGNOSIS — I2510 Atherosclerotic heart disease of native coronary artery without angina pectoris: Secondary | ICD-10-CM | POA: Diagnosis present

## 2023-03-11 DIAGNOSIS — Z885 Allergy status to narcotic agent status: Secondary | ICD-10-CM

## 2023-03-11 DIAGNOSIS — I6521 Occlusion and stenosis of right carotid artery: Principal | ICD-10-CM | POA: Diagnosis present

## 2023-03-11 DIAGNOSIS — G2581 Restless legs syndrome: Secondary | ICD-10-CM | POA: Diagnosis present

## 2023-03-11 DIAGNOSIS — N182 Chronic kidney disease, stage 2 (mild): Secondary | ICD-10-CM | POA: Diagnosis present

## 2023-03-11 DIAGNOSIS — Z86010 Personal history of colonic polyps: Secondary | ICD-10-CM

## 2023-03-11 DIAGNOSIS — Z8673 Personal history of transient ischemic attack (TIA), and cerebral infarction without residual deficits: Secondary | ICD-10-CM | POA: Diagnosis not present

## 2023-03-11 DIAGNOSIS — I152 Hypertension secondary to endocrine disorders: Secondary | ICD-10-CM | POA: Diagnosis present

## 2023-03-11 DIAGNOSIS — E1122 Type 2 diabetes mellitus with diabetic chronic kidney disease: Secondary | ICD-10-CM | POA: Diagnosis present

## 2023-03-11 DIAGNOSIS — Z95828 Presence of other vascular implants and grafts: Secondary | ICD-10-CM

## 2023-03-11 DIAGNOSIS — Z951 Presence of aortocoronary bypass graft: Secondary | ICD-10-CM | POA: Diagnosis not present

## 2023-03-11 DIAGNOSIS — E785 Hyperlipidemia, unspecified: Secondary | ICD-10-CM | POA: Diagnosis present

## 2023-03-11 DIAGNOSIS — E1159 Type 2 diabetes mellitus with other circulatory complications: Secondary | ICD-10-CM | POA: Diagnosis present

## 2023-03-11 DIAGNOSIS — Z7984 Long term (current) use of oral hypoglycemic drugs: Secondary | ICD-10-CM

## 2023-03-11 DIAGNOSIS — Z833 Family history of diabetes mellitus: Secondary | ICD-10-CM

## 2023-03-11 DIAGNOSIS — I9581 Postprocedural hypotension: Secondary | ICD-10-CM | POA: Diagnosis not present

## 2023-03-11 HISTORY — PX: CAROTID PTA/STENT INTERVENTION: CATH118231

## 2023-03-11 LAB — CREATININE, SERUM
Creatinine, Ser: 1.39 mg/dL — ABNORMAL HIGH (ref 0.61–1.24)
GFR, Estimated: 55 mL/min — ABNORMAL LOW (ref >60)

## 2023-03-11 LAB — GLUCOSE, CAPILLARY
Glucose-Capillary: 261 mg/dL — ABNORMAL HIGH (ref 70–99)
Glucose-Capillary: 252 mg/dL — ABNORMAL HIGH (ref 70–99)
Glucose-Capillary: 324 mg/dL — ABNORMAL HIGH (ref 70–99)

## 2023-03-11 LAB — MRSA NEXT GEN BY PCR, NASAL: MRSA by PCR Next Gen: NOT DETECTED

## 2023-03-11 LAB — POCT ACTIVATED CLOTTING TIME: Activated Clotting Time: 244 seconds

## 2023-03-11 LAB — BUN: BUN: 18 mg/dL (ref 8–23)

## 2023-03-11 SURGERY — CAROTID PTA/STENT INTERVENTION
Anesthesia: Moderate Sedation | Laterality: Right

## 2023-03-11 MED ORDER — PANTOPRAZOLE SODIUM 40 MG PO TBEC
40.0000 mg | DELAYED_RELEASE_TABLET | Freq: Every day | ORAL | Status: DC
  Administered 2023-03-12 – 2023-03-13 (×2): 40 mg via ORAL
  Filled 2023-03-11 (×2): qty 1

## 2023-03-11 MED ORDER — DOXAZOSIN MESYLATE 2 MG PO TABS
2.0000 mg | ORAL_TABLET | Freq: Every day | ORAL | Status: DC
  Administered 2023-03-11 – 2023-03-13 (×3): 2 mg via ORAL
  Filled 2023-03-11 (×3): qty 1

## 2023-03-11 MED ORDER — OXYCODONE-ACETAMINOPHEN 5-325 MG PO TABS
1.0000 | ORAL_TABLET | ORAL | Status: DC | PRN
  Administered 2023-03-11: 1 via ORAL
  Filled 2023-03-11: qty 1

## 2023-03-11 MED ORDER — PHENOL 1.4 % MT LIQD: 1.0000 | OROMUCOSAL | Status: DC | PRN

## 2023-03-11 MED ORDER — FAMOTIDINE 20 MG PO TABS: 40.0000 mg | ORAL_TABLET | Freq: Once | ORAL | Status: DC | PRN

## 2023-03-11 MED ORDER — INSULIN GLARGINE-YFGN 100 UNIT/ML ~~LOC~~ SOLN
35.0000 [IU] | Freq: Every day | SUBCUTANEOUS | Status: DC
  Administered 2023-03-11 – 2023-03-12 (×2): 35 [IU] via SUBCUTANEOUS
  Filled 2023-03-11 (×2): qty 0.35

## 2023-03-11 MED ORDER — FENTANYL CITRATE (PF) 100 MCG/2ML IJ SOLN
INTRAMUSCULAR | Status: DC | PRN
  Administered 2023-03-11: 50 ug via INTRAVENOUS
  Administered 2023-03-11: 25 ug via INTRAVENOUS

## 2023-03-11 MED ORDER — METOPROLOL TARTRATE 5 MG/5ML IV SOLN: 2.0000 mg | INTRAVENOUS | Status: DC | PRN

## 2023-03-11 MED ORDER — POTASSIUM CHLORIDE CRYS ER 20 MEQ PO TBCR: 20.0000 meq | EXTENDED_RELEASE_TABLET | Freq: Every day | ORAL | Status: DC | PRN

## 2023-03-11 MED ORDER — MIDAZOLAM HCL 2 MG/ML PO SYRP: 8.0000 mg | ORAL_SOLUTION | Freq: Once | ORAL | Status: DC | PRN

## 2023-03-11 MED ORDER — ONDANSETRON HCL 4 MG/2ML IJ SOLN: 4.0000 mg | Freq: Four times a day (QID) | INTRAMUSCULAR | Status: DC | PRN

## 2023-03-11 MED ORDER — HEPARIN SODIUM (PORCINE) 1000 UNIT/ML IJ SOLN
INTRAMUSCULAR | Status: AC
  Filled 2023-03-11: qty 10

## 2023-03-11 MED ORDER — FENTANYL CITRATE (PF) 100 MCG/2ML IJ SOLN
INTRAMUSCULAR | Status: AC
  Filled 2023-03-11: qty 2

## 2023-03-11 MED ORDER — MIDAZOLAM HCL 5 MG/5ML IJ SOLN
INTRAMUSCULAR | Status: AC
  Filled 2023-03-11: qty 5

## 2023-03-11 MED ORDER — LOSARTAN POTASSIUM 50 MG PO TABS
100.0000 mg | ORAL_TABLET | Freq: Every day | ORAL | Status: DC
  Administered 2023-03-12 – 2023-03-13 (×2): 100 mg via ORAL
  Filled 2023-03-11 (×2): qty 2

## 2023-03-11 MED ORDER — FAMOTIDINE IN NACL 20-0.9 MG/50ML-% IV SOLN
20.0000 mg | Freq: Two times a day (BID) | INTRAVENOUS | Status: DC
  Administered 2023-03-11 – 2023-03-12 (×3): 20 mg via INTRAVENOUS
  Filled 2023-03-11 (×3): qty 50

## 2023-03-11 MED ORDER — SODIUM CHLORIDE 0.9 % IV SOLN: 500.0000 mL | Freq: Once | INTRAVENOUS | Status: DC | PRN

## 2023-03-11 MED ORDER — PHENYLEPHRINE HCL-NACL 20-0.9 MG/250ML-% IV SOLN
INTRAVENOUS | Status: AC
  Filled 2023-03-11: qty 250

## 2023-03-11 MED ORDER — CEFAZOLIN SODIUM-DEXTROSE 2-4 GM/100ML-% IV SOLN
2.0000 g | Freq: Three times a day (TID) | INTRAVENOUS | Status: AC
  Administered 2023-03-11 – 2023-03-12 (×2): 2 g via INTRAVENOUS
  Filled 2023-03-11 (×2): qty 100

## 2023-03-11 MED ORDER — CEFAZOLIN SODIUM-DEXTROSE 2-4 GM/100ML-% IV SOLN
INTRAVENOUS | Status: AC
  Filled 2023-03-11: qty 100

## 2023-03-11 MED ORDER — DIPHENHYDRAMINE HCL 50 MG/ML IJ SOLN: 50.0000 mg | Freq: Once | INTRAMUSCULAR | Status: DC | PRN

## 2023-03-11 MED ORDER — FERROUS SULFATE 325 (65 FE) MG PO TABS
325.0000 mg | ORAL_TABLET | Freq: Every day | ORAL | Status: DC
  Administered 2023-03-12 – 2023-03-13 (×2): 325 mg via ORAL
  Filled 2023-03-11 (×2): qty 1

## 2023-03-11 MED ORDER — GUAIFENESIN-DM 100-10 MG/5ML PO SYRP: 15.0000 mL | ORAL_SOLUTION | ORAL | Status: DC | PRN

## 2023-03-11 MED ORDER — METHYLPREDNISOLONE SODIUM SUCC 125 MG IJ SOLR: 125.0000 mg | Freq: Once | INTRAMUSCULAR | Status: DC | PRN

## 2023-03-11 MED ORDER — ORAL CARE MOUTH RINSE: 15.0000 mL | OROMUCOSAL | Status: DC | PRN

## 2023-03-11 MED ORDER — LABETALOL HCL 5 MG/ML IV SOLN: 10.0000 mg | INTRAVENOUS | Status: DC | PRN

## 2023-03-11 MED ORDER — MAGNESIUM SULFATE 2 GM/50ML IV SOLN: 2.0000 g | Freq: Every day | INTRAVENOUS | Status: DC | PRN

## 2023-03-11 MED ORDER — MORPHINE SULFATE (PF) 4 MG/ML IV SOLN: 2.0000 mg | INTRAVENOUS | Status: DC | PRN

## 2023-03-11 MED ORDER — HYDRALAZINE HCL 20 MG/ML IJ SOLN: 5.0000 mg | INTRAMUSCULAR | Status: DC | PRN

## 2023-03-11 MED ORDER — ACETAMINOPHEN 325 MG PO TABS
325.0000 mg | ORAL_TABLET | ORAL | Status: DC | PRN
  Administered 2023-03-12 – 2023-03-13 (×2): 650 mg via ORAL
  Filled 2023-03-11 (×3): qty 2

## 2023-03-11 MED ORDER — ATROPINE SULFATE 1 MG/10ML IJ SOSY
PREFILLED_SYRINGE | INTRAMUSCULAR | Status: AC
  Filled 2023-03-11: qty 10

## 2023-03-11 MED ORDER — HYDROMORPHONE HCL 1 MG/ML IJ SOLN: 1.0000 mg | Freq: Once | INTRAMUSCULAR | Status: DC | PRN

## 2023-03-11 MED ORDER — METOPROLOL TARTRATE 25 MG PO TABS
25.0000 mg | ORAL_TABLET | Freq: Two times a day (BID) | ORAL | Status: DC
  Administered 2023-03-13: 25 mg via ORAL
  Filled 2023-03-11: qty 1

## 2023-03-11 MED ORDER — HEPARIN SODIUM (PORCINE) 1000 UNIT/ML IJ SOLN
INTRAMUSCULAR | Status: DC | PRN
  Administered 2023-03-11: 8000 [IU] via INTRAVENOUS
  Administered 2023-03-11: 2000 [IU] via INTRAVENOUS

## 2023-03-11 MED ORDER — SODIUM CHLORIDE 0.9 % IV SOLN: INTRAVENOUS | Status: DC

## 2023-03-11 MED ORDER — VITAMIN D (ERGOCALCIFEROL) 1.25 MG (50000 UNIT) PO CAPS: 50000.0000 [IU] | ORAL_CAPSULE | ORAL | Status: DC

## 2023-03-11 MED ORDER — ISOSORBIDE MONONITRATE ER 30 MG PO TB24
60.0000 mg | ORAL_TABLET | Freq: Every day | ORAL | Status: DC
  Administered 2023-03-12 – 2023-03-13 (×2): 60 mg via ORAL
  Filled 2023-03-11 (×2): qty 2

## 2023-03-11 MED ORDER — ROSUVASTATIN CALCIUM 10 MG PO TABS
20.0000 mg | ORAL_TABLET | Freq: Every day | ORAL | Status: DC
  Administered 2023-03-11 – 2023-03-12 (×2): 20 mg via ORAL
  Filled 2023-03-11 (×2): qty 2

## 2023-03-11 MED ORDER — ATROPINE SULFATE 1 MG/10ML IJ SOSY
PREFILLED_SYRINGE | INTRAMUSCULAR | Status: DC | PRN
  Administered 2023-03-11: 1 mg via INTRAVENOUS

## 2023-03-11 MED ORDER — MIDAZOLAM HCL 2 MG/2ML IJ SOLN
INTRAMUSCULAR | Status: DC | PRN
  Administered 2023-03-11: 2 mg via INTRAVENOUS
  Administered 2023-03-11: 1 mg via INTRAVENOUS

## 2023-03-11 MED ORDER — ALUM & MAG HYDROXIDE-SIMETH 200-200-20 MG/5ML PO SUSP: 15.0000 mL | ORAL | Status: DC | PRN

## 2023-03-11 MED ORDER — GLUCOSAMINE-CHONDROITIN 500-400 MG PO TABS: 1.0000 | ORAL_TABLET | Freq: Every day | ORAL | Status: DC

## 2023-03-11 MED ORDER — ACETAMINOPHEN 325 MG RE SUPP: 325.0000 mg | RECTAL | Status: DC | PRN

## 2023-03-11 MED ORDER — CEFAZOLIN SODIUM-DEXTROSE 2-4 GM/100ML-% IV SOLN: 2.0000 g | INTRAVENOUS | Status: DC

## 2023-03-11 MED ORDER — CEFAZOLIN SODIUM-DEXTROSE 1-4 GM/50ML-% IV SOLN
INTRAVENOUS | Status: DC | PRN
  Administered 2023-03-11: 2 g via INTRAVENOUS

## 2023-03-11 MED ORDER — AMLODIPINE BESYLATE 10 MG PO TABS
10.0000 mg | ORAL_TABLET | Freq: Every day | ORAL | Status: DC
  Administered 2023-03-12 – 2023-03-13 (×2): 10 mg via ORAL
  Filled 2023-03-11 (×2): qty 1

## 2023-03-11 MED ORDER — GABAPENTIN 100 MG PO CAPS
100.0000 mg | ORAL_CAPSULE | Freq: Two times a day (BID) | ORAL | Status: DC
  Administered 2023-03-11 – 2023-03-13 (×5): 100 mg via ORAL
  Filled 2023-03-11 (×5): qty 1

## 2023-03-11 MED ORDER — PHENYLEPHRINE 80 MCG/ML (10ML) SYRINGE FOR IV PUSH (FOR BLOOD PRESSURE SUPPORT)
PREFILLED_SYRINGE | INTRAVENOUS | Status: AC
  Filled 2023-03-11: qty 10

## 2023-03-11 MED ORDER — ASPIRIN 81 MG PO TBEC
81.0000 mg | DELAYED_RELEASE_TABLET | Freq: Every day | ORAL | Status: DC
  Administered 2023-03-12 – 2023-03-13 (×2): 81 mg via ORAL
  Filled 2023-03-11 (×2): qty 1

## 2023-03-11 MED ORDER — TAMSULOSIN HCL 0.4 MG PO CAPS
0.4000 mg | ORAL_CAPSULE | Freq: Every day | ORAL | Status: DC
  Administered 2023-03-12 – 2023-03-13 (×2): 0.4 mg via ORAL
  Filled 2023-03-11 (×2): qty 1

## 2023-03-11 MED ORDER — CLOPIDOGREL BISULFATE 75 MG PO TABS
75.0000 mg | ORAL_TABLET | Freq: Every day | ORAL | Status: DC
  Administered 2023-03-12 – 2023-03-13 (×2): 75 mg via ORAL
  Filled 2023-03-11 (×2): qty 1

## 2023-03-11 MED ORDER — DOPAMINE-DEXTROSE 3.2-5 MG/ML-% IV SOLN
INTRAVENOUS | Status: AC
  Filled 2023-03-11: qty 250

## 2023-03-11 MED ORDER — VITAMIN B-12 1000 MCG PO TABS
1000.0000 ug | ORAL_TABLET | Freq: Every day | ORAL | Status: DC
  Administered 2023-03-11 – 2023-03-13 (×3): 1000 ug via ORAL
  Filled 2023-03-11 (×3): qty 1

## 2023-03-11 SURGICAL SUPPLY — 20 items
BALLN VIATRAC 5X30X135 (BALLOONS) ×1
BALLOON VIATRAC 5X30X135 (BALLOONS) IMPLANT
CATH ANGIO 5F PIGTAIL 100CM (CATHETERS) IMPLANT
CATH HEADHUNTER H1 5F 100CM (CATHETERS) IMPLANT
COVER DRAPE FLUORO 36X44 (DRAPES) IMPLANT
COVER PROBE ULTRASOUND 5X96 (MISCELLANEOUS) IMPLANT
DEVICE EMBOSHIELD NAV6 4.0-7.0 (FILTER) IMPLANT
DEVICE SAFEGUARD 24CM (GAUZE/BANDAGES/DRESSINGS) IMPLANT
DEVICE STARCLOSE SE CLOSURE (Vascular Products) IMPLANT
DEVICE TORQUE .025-.038 (MISCELLANEOUS) IMPLANT
GLIDEWIRE ANGLED SS 035X260CM (WIRE) IMPLANT
GUIDEWIRE VASC STIFF .038X260 (WIRE) IMPLANT
KIT CAROTID MANIFOLD (MISCELLANEOUS) IMPLANT
KIT ENCORE 26 ADVANTAGE (KITS) IMPLANT
PACK ANGIOGRAPHY (CUSTOM PROCEDURE TRAY) ×1 IMPLANT
SHEATH BRITE TIP 5FRX11 (SHEATH) IMPLANT
SHEATH SHUTTLE SELECT 6F (SHEATH) IMPLANT
STENT XACT CAR 9-7X40X136 (Permanent Stent) IMPLANT
SYR MEDRAD MARK 7 150ML (SYRINGE) IMPLANT
WIRE GUIDERIGHT .035X150 (WIRE) IMPLANT

## 2023-03-11 NOTE — Progress Notes (Signed)
Mr. Ako a Doerflein is a 72 year old male who is status post right carotid artery stent placement from earlier today.  Patient's right femoral artery was accessed.  Dressing is intact and clean and dry.  No hematoma seroma to note this afternoon.  Postprocedure patient's blood pressure is within normal limits.  Patient's heart rate ranges from 50-60.  He has normal heart rate is 66-72.  Patient denies any chest pain shortness of breath or dizziness at this time.  Was on a clear liquid diet and would like to be changed to a regular diet.  I ordered him a regular diet heart healthy.  Patient has no other complaints at this time.  Vitals all remained stable.

## 2023-03-11 NOTE — Progress Notes (Signed)
Late entering, received pt from cath lab S/P left carotid stent  around 1500 to CCU room 14, pt was drowsy but arousable, able to follow command, A/Ox4 , MAE equally, pupils are equal and reactive to light.  VSS. Right femoral  artery with 40 ml's air in place, site with no hematoma. Pulses are palpable and strong. Pt and wife oriented to room and call light. Call light at reach. Pt Denis any pain or need. NIHS is zero. Pt tolerated clear liquid well. Pt wanted regular diet and MD was notified and pt was placed on  diet and he tolerated it well. Pt voiding with any problem.

## 2023-03-11 NOTE — Op Note (Signed)
OPERATIVE NOTE DATE: 03/11/2023  PROCEDURE:  Ultrasound guidance for vascular access right femoral artery  Placement of a 9 mm proximal, 7 mm distal, 4 cm long exact stent with the use of the NAV-6 embolic protection device in the right carotid artery  PRE-OPERATIVE DIAGNOSIS: 1.  High-grade right carotid artery stenosis. 2.  Previous left carotid stent for high-grade left carotid stenosis with previous left hemispheric stroke  POST-OPERATIVE DIAGNOSIS:  Same as above  SURGEON: [name], MD  ASSISTANT(S): None  ANESTHESIA: local/MCS  ESTIMATED BLOOD LOSS: 10 cc  CONTRAST: 70 cc  FLUORO TIME: 4 minutes  MODERATE CONSCIOUS SEDATION TIME:  Approximately 41 minutes using 3 mg of Versed and 75 mcg of Fentanyl  FINDING(S): 1.   80% carotid artery stenosis  SPECIMEN(S):   none  INDICATIONS:   Patient is a 72 y.o. male who presents with high-grade right carotid artery stenosis.  The patient has previously had a left carotid stent for symptomatic lesion on that side and did very well and he also has a litany of medical issues and carotid artery stenting was felt to be preferred to endarterectomy for that reason. I have completed Share Decision Making with Bob Miller prior to surgery.  Conversations included: -Discussion of all treatment options including carotid endarterectomy (CEA), CAS (which includes transcarotid artery revascularization (TCAR)), and optimal medical therapy (OMT)). -Explanation of risks and benefits for each option specific to Bob Miller clinical situation. -Integration of clinical guidelines as it relates to the patient's history and co-morbidities -Discussion and incorporation of Bob Miller and their personal preferences and priorities in choosing a treatment plan.    Risks and benefits were discussed and informed consent was obtained.   DESCRIPTION: After obtaining full informed written consent, the patient was brought back to the vascular suite and  placed supine upon the table.  The patient received IV antibiotics prior to induction. Moderate conscious sedation was administered during a face to face encounter with the patient throughout the procedure with my supervision of the RN administering medicines and monitoring the patients vital signs and mental status throughout from the start of the procedure until the patient was taken to the recovery room.  After obtaining adequate anesthesia, the patient was prepped and draped in the standard fashion.   The right femoral artery was visualized with ultrasound and found to be widely patent. It was then accessed under direct ultrasound guidance without difficulty with a Seldinger needle. A permanent image was recorded. A J-wire was placed and we then placed a 6 French sheath. The patient was then heparinized and a total of 10,000 units of intravenous heparin were given and an ACT was checked to confirm successful anticoagulation. A pigtail catheter was then placed into the ascending aorta. This showed a bovine type II aortic arch without proximal stenosis. I then selectively cannulated the innominate artery without difficulty with a 100 catheter and advanced into the mid right common carotid artery.  Cervical and cerebral carotid angiography was then performed. There were no obvious intracranial filling defects with no significant cross-filling right to left. The carotid bifurcation demonstrated a moderately calcific stenosis of approximately 80% 1 to 1-1/2 cm beyond the origin of the internal carotid artery with calcification in the carotid bulb and the distal common carotid artery as well.  I then advanced into the external carotid artery with a Glidewire and the headhunter catheter and then exchanged for the Amplatz Super Stiff wire. Over the Amplatz Super Stiff wire,  a 6 [place] shuttle sheath was placed into the mid common carotid artery. I then used the NAV-6  Embolic protection device and crossed the lesion and  parked this in the distal internal carotid artery at the base of the skull.  I then selected a 9 mm proximal, 7 mm distal, 4 cm long exact stent. This was deployed across the lesion encompassing it in its entirety. A 5 mm diameter by 3 cm length balloon was used to post dilate the stent. Only about a 20% residual stenosis was present after angioplasty. Completion angiogram showed normal intracranial filling without new defects. At this point I elected to terminate the procedure. The sheath was removed and StarClose closure device was deployed in the right femoral artery with excellent hemostatic result. The patient was taken to the recovery room in stable condition having tolerated the procedure well.  COMPLICATIONS: none  CONDITION: stable  [name] 03/11/2023 12:59 PM   This note was created with Dragon Medical transcription system. Any errors in dictation are purely unintentional.

## 2023-03-11 NOTE — Interval H&P Note (Signed)
History and Physical Interval Note:  03/11/2023 9:44 AM  Bob Miller  has presented today for surgery, with the diagnosis of R Carotid Stent   ABBOTT   Carotid artery stenosis with infarct.  The various methods of treatment have been discussed with the patient and family. After consideration of risks, benefits and other options for treatment, the patient has consented to  Procedure(s): CAROTID PTA/STENT INTERVENTION (Right) as a surgical intervention.  The patient's history has been reviewed, patient examined, no change in status, stable for surgery.  I have reviewed the patient's chart and labs.  Questions were answered to the patient's satisfaction.     [name]

## 2023-03-12 ENCOUNTER — Encounter: Payer: Self-pay | Admitting: Vascular Surgery

## 2023-03-12 LAB — BASIC METABOLIC PANEL
Anion gap: 7 (ref 5–15)
BUN: 18 mg/dL (ref 8–23)
CO2: 25 mmol/L (ref 22–32)
Calcium: 8.3 mg/dL — ABNORMAL LOW (ref 8.9–10.3)
Chloride: 102 mmol/L (ref 98–111)
Creatinine, Ser: 1.42 mg/dL — ABNORMAL HIGH (ref 0.61–1.24)
GFR, Estimated: 53 mL/min — ABNORMAL LOW (ref >60)
Glucose, Bld: 279 mg/dL — ABNORMAL HIGH (ref 70–99)
Potassium: 3.8 mmol/L (ref 3.5–5.1)
Sodium: 134 mmol/L — ABNORMAL LOW (ref 135–145)

## 2023-03-12 LAB — CBC
HCT: 37.7 % — ABNORMAL LOW (ref 39.0–52.0)
Hemoglobin: 12.1 g/dL — ABNORMAL LOW (ref 13.0–17.0)
MCH: 27.2 pg (ref 26.0–34.0)
MCHC: 32.1 g/dL (ref 30.0–36.0)
MCV: 84.7 fL (ref 80.0–100.0)
Platelets: 172 10*3/uL (ref 150–400)
RBC: 4.45 MIL/uL (ref 4.22–5.81)
RDW: 14 % (ref 11.5–15.5)
WBC: 8.9 10*3/uL (ref 4.0–10.5)
nRBC: 0 % (ref 0.0–0.2)

## 2023-03-12 LAB — GLUCOSE, CAPILLARY
Glucose-Capillary: 319 mg/dL — ABNORMAL HIGH (ref 70–99)
Glucose-Capillary: 260 mg/dL — ABNORMAL HIGH (ref 70–99)

## 2023-03-12 MED ORDER — CHLORHEXIDINE GLUCONATE CLOTH 2 % EX PADS
6.0000 | MEDICATED_PAD | Freq: Every day | CUTANEOUS | Status: DC
  Administered 2023-03-12: 6 via TOPICAL

## 2023-03-12 MED ORDER — ROSUVASTATIN CALCIUM 10 MG PO TABS
40.0000 mg | ORAL_TABLET | Freq: Every day | ORAL | Status: DC
  Administered 2023-03-13: 40 mg via ORAL
  Filled 2023-03-12: qty 4

## 2023-03-12 MED ORDER — INSULIN ASPART 100 UNIT/ML IJ SOLN
0.0000 [IU] | Freq: Three times a day (TID) | INTRAMUSCULAR | Status: DC
  Administered 2023-03-12: 11 [IU] via SUBCUTANEOUS
  Administered 2023-03-13: 3 [IU] via SUBCUTANEOUS
  Filled 2023-03-12 (×2): qty 1

## 2023-03-12 MED ORDER — INSULIN ASPART 100 UNIT/ML IJ SOLN
0.0000 [IU] | Freq: Every day | INTRAMUSCULAR | Status: DC
  Administered 2023-03-12: 3 [IU] via SUBCUTANEOUS
  Filled 2023-03-12: qty 1

## 2023-03-12 NOTE — Progress Notes (Signed)
  Progress Note    03/12/2023 11:30 AM 1 Day Post-Op  Subjective:  [name] is a 72 year old male who is status post right carotid artery stent placement from earlier today. Patient's right femoral artery was accessed. Dressing is intact and clean and dry. No hematoma seroma to note this afternoon. Postprocedure patient's blood pressure is within normal limits. Patient's heart rate ranges from 40-50 over night and through this morning. He has normal heart rate is 66-72. Patient denies any chest pain shortness of breath or dizziness at this time. He denies any focal deficits and denies any headache or blurred vision.    Vitals:   03/12/23 0800 03/12/23 0900  BP: 136/62 138/63  Pulse:  (!) 48  Resp: 11 13  Temp: 98 F (36.7 C)   SpO2:  95%   Physical Exam: Cardiac:  RRR running Sinus Bradycardia in the 40-50 range.  Lungs: On auscultation throughout, normal respiratory effort Incisions: Right femoral artery with dressing clean dry and intact no hematoma seroma or infection to note. Extremities: Lower extremities positive for palpable pulses, popliteal, posttibial, dorsalis pedis.  Right lower extremity remains warm to touch. Abdomen: Positive bowel sounds, soft, flat, nondistended, nontender. Neurologic: And oriented x 3, no neurodeficits to note.  Patient answers all questions appropriately.  Patient follows all commands appropriately.  No signs or symptoms of CVA's or TIA  CBC    Component Value Date/Time   WBC 8.9 03/12/2023 0409   RBC 4.45 03/12/2023 0409   HGB 12.1 (L) 03/12/2023 0409   HCT 37.7 (L) 03/12/2023 0409   PLT 172 03/12/2023 0409   MCV 84.7 03/12/2023 0409   MCH 27.2 03/12/2023 0409   MCHC 32.1 03/12/2023 0409   RDW 14.0 03/12/2023 0409   LYMPHSABS 3.4 10/30/2022 1842   MONOABS 0.8 10/30/2022 1842   EOSABS 0.3 10/30/2022 1842   BASOSABS 0.1 10/30/2022 1842    BMET    Component Value Date/Time   NA 134 (L) 03/12/2023 0409   K 3.8 03/12/2023 0409    CL 102 03/12/2023 0409   CO2 25 03/12/2023 0409   GLUCOSE 279 (H) 03/12/2023 0409   BUN 18 03/12/2023 0409   CREATININE 1.42 (H) 03/12/2023 0409   CALCIUM 8.3 (L) 03/12/2023 0409   GFRNONAA 53 (L) 03/12/2023 0409    INR    Component Value Date/Time   INR 1.0 10/30/2022 1842     Intake/Output Summary (Last 24 hours) at 03/12/2023 1130 Last data filed at 03/12/2023 0800 Gross per 24 hour  Intake 2688.79 ml  Output 2900 ml  Net -211.21 ml     Assessment/Plan:  72 y.o. male is s/p vascular carotid stent placement.  1 Day Post-Op   PLAN: Patient will remain in ICU until vital signs stabilized.  Patient's blood pressure was relatively soft overnight but returning to his normal blood pressure this morning.  Patient's heart rate down in the 40s to the 50s this morning which is considerably lower than his normal.  Will continue to monitor vital signs throughout the day.  Patient was started on aspirin and Plavix this morning.  Tolerated well.  The patient normalizes by later this afternoon he may be discharged if not patient will stay 1 more night and be reevaluated in the morning for possible discharge.  DVT prophylaxis:  ASA 81 MG daily and Plavix 75 mg Daily   Laurna Shetley [facility] 03/12/2023 11:30 AM

## 2023-03-12 NOTE — Inpatient Diabetes Management (Signed)
Inpatient Diabetes Program Recommendations  AACE/ADA: New Consensus Statement on Inpatient Glycemic Control  Target Ranges:  Prepandial:   less than 140 mg/dL      Peak postprandial:   less than 180 mg/dL (1-2 hours)      Critically ill patients:  140 - 180 mg/dL    Latest Reference Range & Units 03/11/23 09:44 03/11/23 14:11 03/11/23 21:54  Glucose-Capillary 70 - 99 mg/dL 261 (H) 252 (H) 324 (H)   Review of Glycemic Control  Diabetes history: DM2 Outpatient Diabetes medications: Basaglar 35 units QHS, Metformin 1000 mg BID Current orders for Inpatient glycemic control: Semglee 35 units QHS  Inpatient Diabetes Program Recommendations:    Insulin: Please consider ordering CBGs AC&HS and Novolog 0-15 units TID with meals and at bedtime.  Thanks, Barnie Alderman, RN, MSN, [place] [facility] Coordinator Inpatient Diabetes Program [phone] (Team Pager from 8am to [place])

## 2023-03-13 LAB — GLUCOSE, CAPILLARY: Glucose-Capillary: 198 mg/dL — ABNORMAL HIGH (ref 70–99)

## 2023-03-13 MED ORDER — ASPIRIN 81 MG PO TBEC: 81.0000 mg | DELAYED_RELEASE_TABLET | Freq: Every day | ORAL | 12 refills | Status: AC

## 2023-03-13 MED ORDER — ACETAMINOPHEN 325 MG PO TABS: 325.0000 mg | ORAL_TABLET | ORAL | 0 refills | Status: AC | PRN

## 2023-03-13 MED ORDER — CLOPIDOGREL BISULFATE 75 MG PO TABS: 75.0000 mg | ORAL_TABLET | Freq: Every day | ORAL | 1 refills | Status: AC

## 2023-03-13 NOTE — Progress Notes (Addendum)
0800 Up in room. Tolerating ambulation. Complaints of right groin soreness 1000 Discharge teaching done. Questions answered. 1024 Discharged home with wife.
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
## 2023-04-06 ENCOUNTER — Encounter (INDEPENDENT_AMBULATORY_CARE_PROVIDER_SITE_OTHER): Payer: Self-pay | Admitting: Vascular Surgery

## 2023-04-06 ENCOUNTER — Ambulatory Visit (INDEPENDENT_AMBULATORY_CARE_PROVIDER_SITE_OTHER): Payer: Medicare Other | Admitting: Vascular Surgery

## 2023-04-06 VITALS — BP 147/73 | HR 66 | Resp 18 | Ht 70.5 in | Wt 233.6 lb

## 2023-04-06 DIAGNOSIS — E1159 Type 2 diabetes mellitus with other circulatory complications: Secondary | ICD-10-CM

## 2023-04-06 DIAGNOSIS — I152 Hypertension secondary to endocrine disorders: Secondary | ICD-10-CM

## 2023-04-06 DIAGNOSIS — I63239 Cerebral infarction due to unspecified occlusion or stenosis of unspecified carotid arteries: Secondary | ICD-10-CM

## 2023-04-06 NOTE — Assessment & Plan Note (Signed)
Doing well status post bilateral carotid stent placement in a staged fashion.  No further neurologic symptoms.  Return to clinic in 2 to 3 months with carotid duplex.  Continue current medical regimen.

## 2023-04-06 NOTE — Assessment & Plan Note (Signed)
blood pressure control important in reducing the progression of atherosclerotic disease. On appropriate oral medications.  

## 2023-04-06 NOTE — Assessment & Plan Note (Signed)
blood glucose control important in reducing the progression of atherosclerotic disease. Also, involved in wound healing. On appropriate medications.  
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
## 2023-05-14 IMAGING — CT CT CHEST-ABD-PELV W/O CM
2 of 4 series · 15 of 46 positions shown, 17 images · non-contrast
Comparison: None Available.

CLINICAL DATA: Weight loss, unintended



[Series 2: cap without · axial · non-contrast · 0.87mm/px · z∈[-902,-292]mm · 12 of 144 slices shown, 14 images]
[im 11/144  soft-tissue]
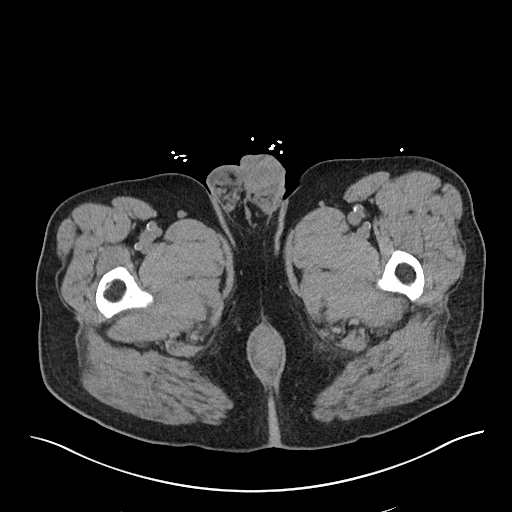
[im 11/144  bone]
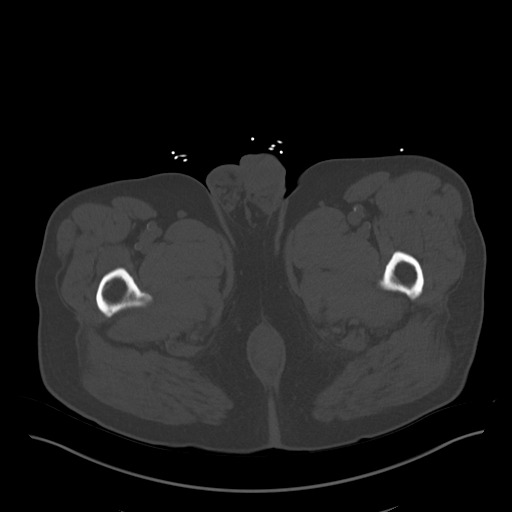
[im 21/144  soft-tissue]
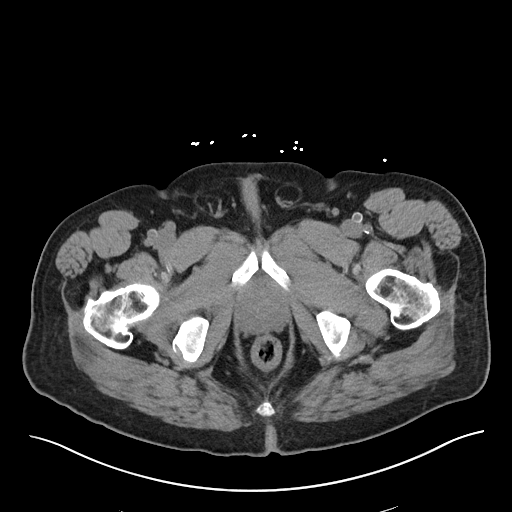
[im 31/144  soft-tissue]
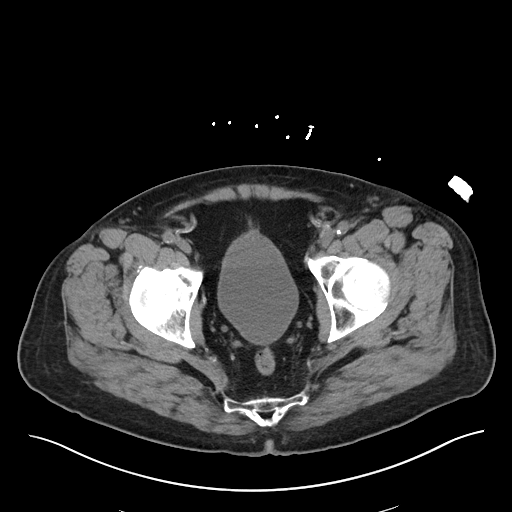
[im 41/144  soft-tissue]
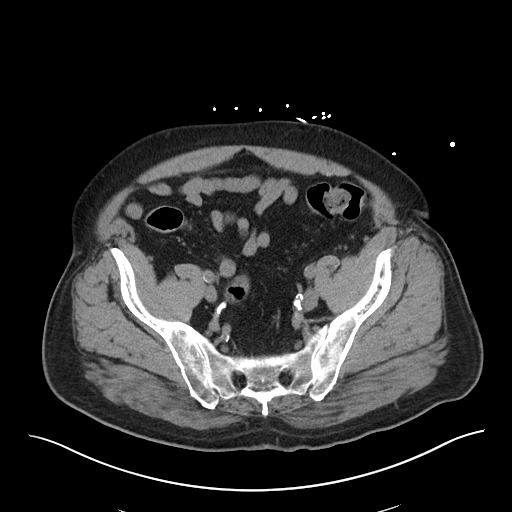
[im 52/144  soft-tissue]
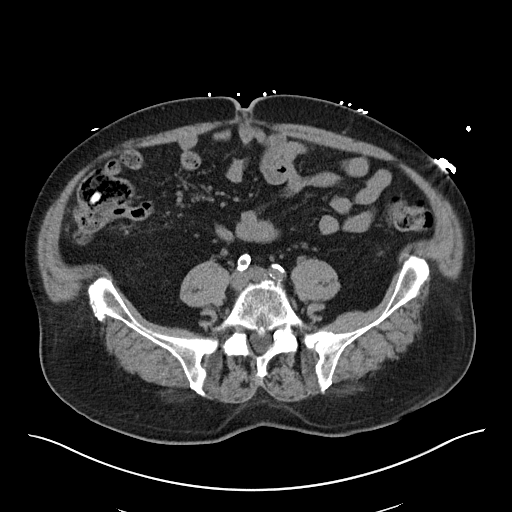
[im 62/144  soft-tissue]
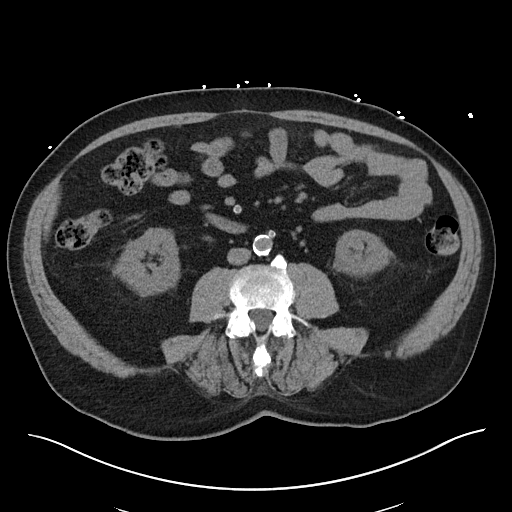
[im 82/144  soft-tissue]
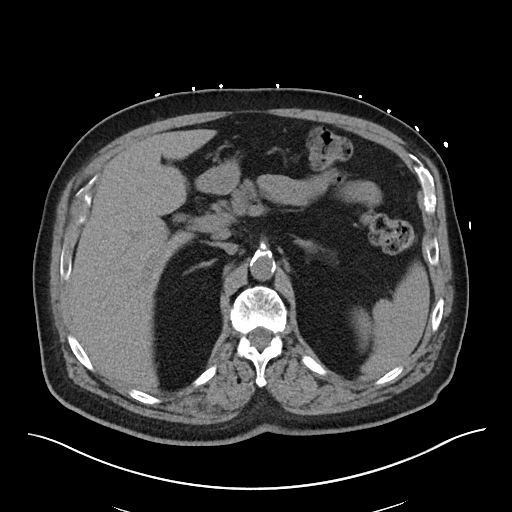
[im 92/144  soft-tissue]
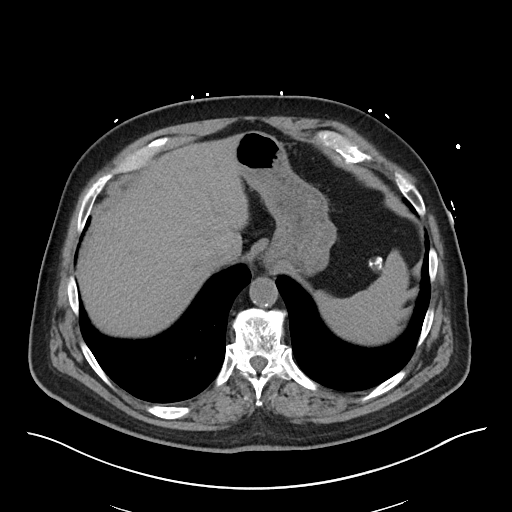
[im 103/144  soft-tissue]
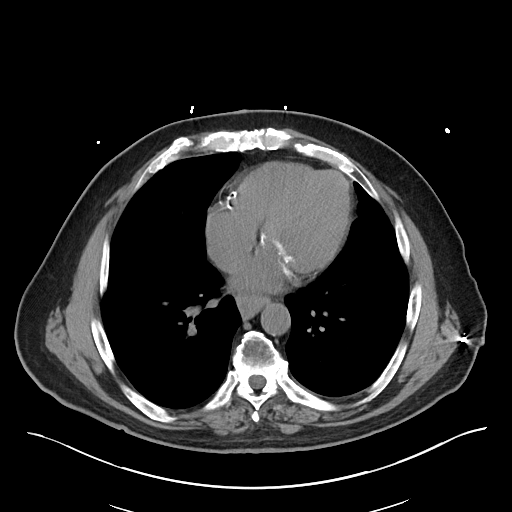
[im 103/144  bone]
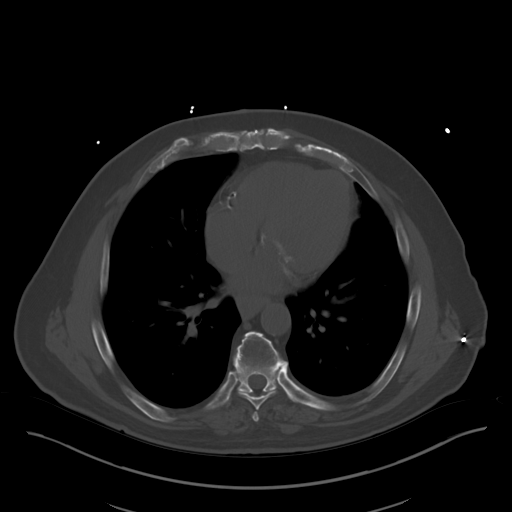
[im 113/144  soft-tissue]
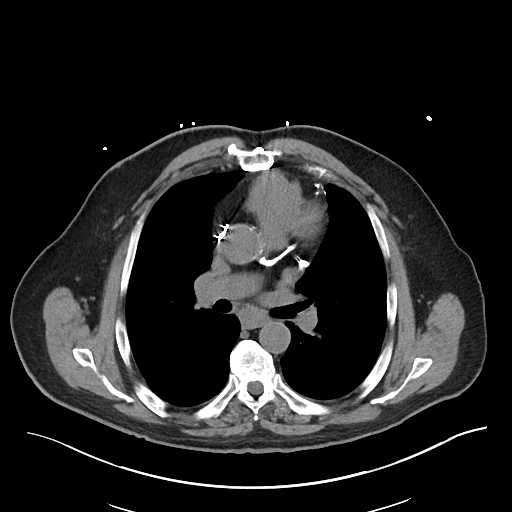
[im 123/144  soft-tissue]
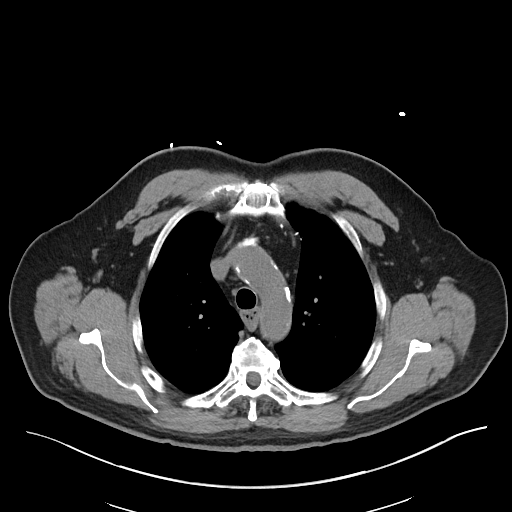
[im 133/144  soft-tissue]
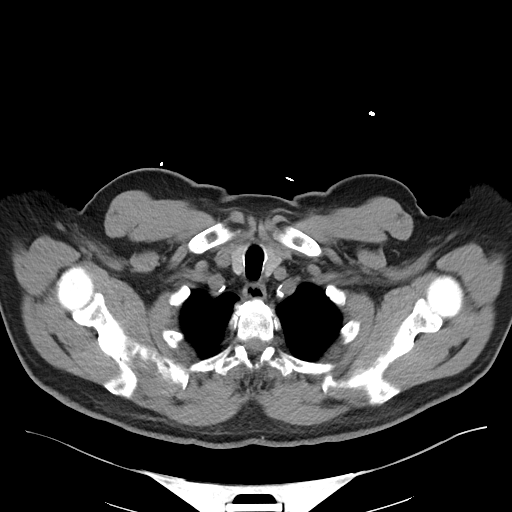

[Series 5: cor · coronal · 0.96mm/px · 3 of 111 slices shown]
[im 37/111  soft-tissue]
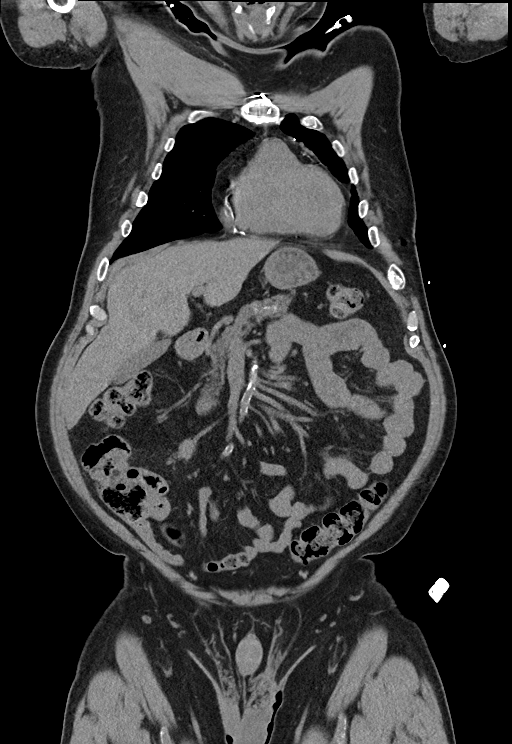
[im 49/111  soft-tissue]
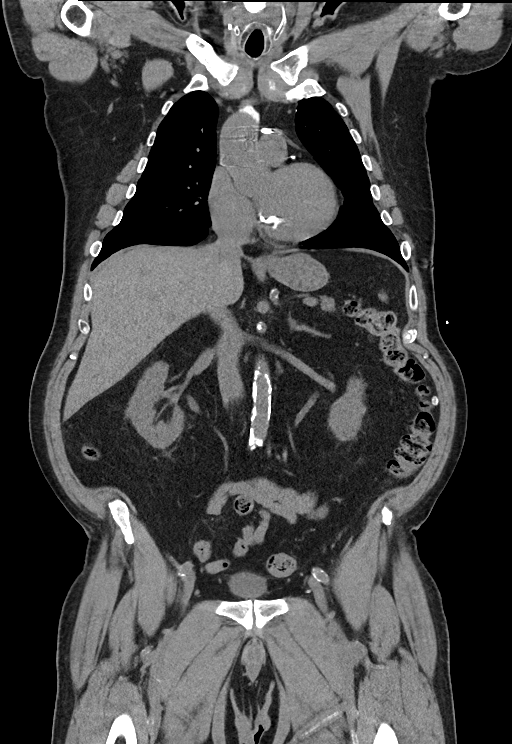
[im 62/111  soft-tissue]
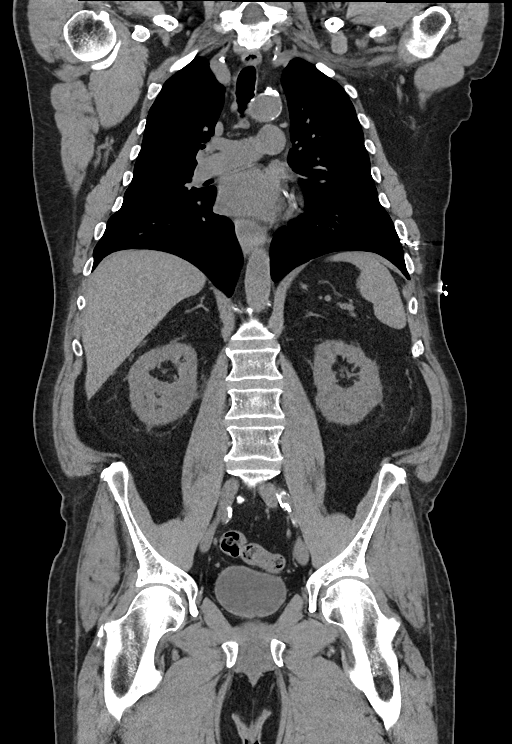

[15 of 46 positions shown; findings below may reference images not displayed]

FINDINGS: CT CHEST FINDINGS

Cardiovascular: Heart is normal size. Prior CABG. Diffuse aortic and
coronary artery calcifications.

Mediastinum/Nodes: No mediastinal, hilar, or axillary adenopathy.
Trachea and esophagus are unremarkable. Thyroid unremarkable.

Lungs/Pleura: Lungs are clear. No focal airspace opacities or
suspicious nodules. No effusions.

Musculoskeletal: Chest wall soft tissues are unremarkable. No acute
bony abnormality.

CT ABDOMEN PELVIS FINDINGS

Hepatobiliary: No focal hepatic abnormality. Gallbladder
unremarkable.

Pancreas: No focal abnormality or ductal dilatation.

Spleen: No focal abnormality.  Normal size.

Adrenals/Urinary Tract: No adrenal abnormality. No focal renal
abnormality. No stones or hydronephrosis. Urinary bladder is
unremarkable.

Stomach/Bowel: Normal appendix. Stomach, large and small bowel
grossly unremarkable.

Vascular/Lymphatic: Aortic atherosclerosis. No evidence of aneurysm
or adenopathy.

Reproductive: Mildly prominent prostate with central calcifications.

Other: No free fluid or free air.

Musculoskeletal: No acute bony abnormality.
IMPRESSION: No acute findings in the chest, abdomen or pelvis.

Diffuse aortic atherosclerosis.  Prior CABG.

## 2023-05-14 IMAGING — CR DG CHEST 2V
2 series · 2 of 2 positions shown · non-contrast
Comparison: None Available.

CLINICAL DATA: Acute onset chest pain radiating to left arm 12
hours ago. Coronary artery disease.

EXAM:
CHEST - 2 VIEW

[chest pa]
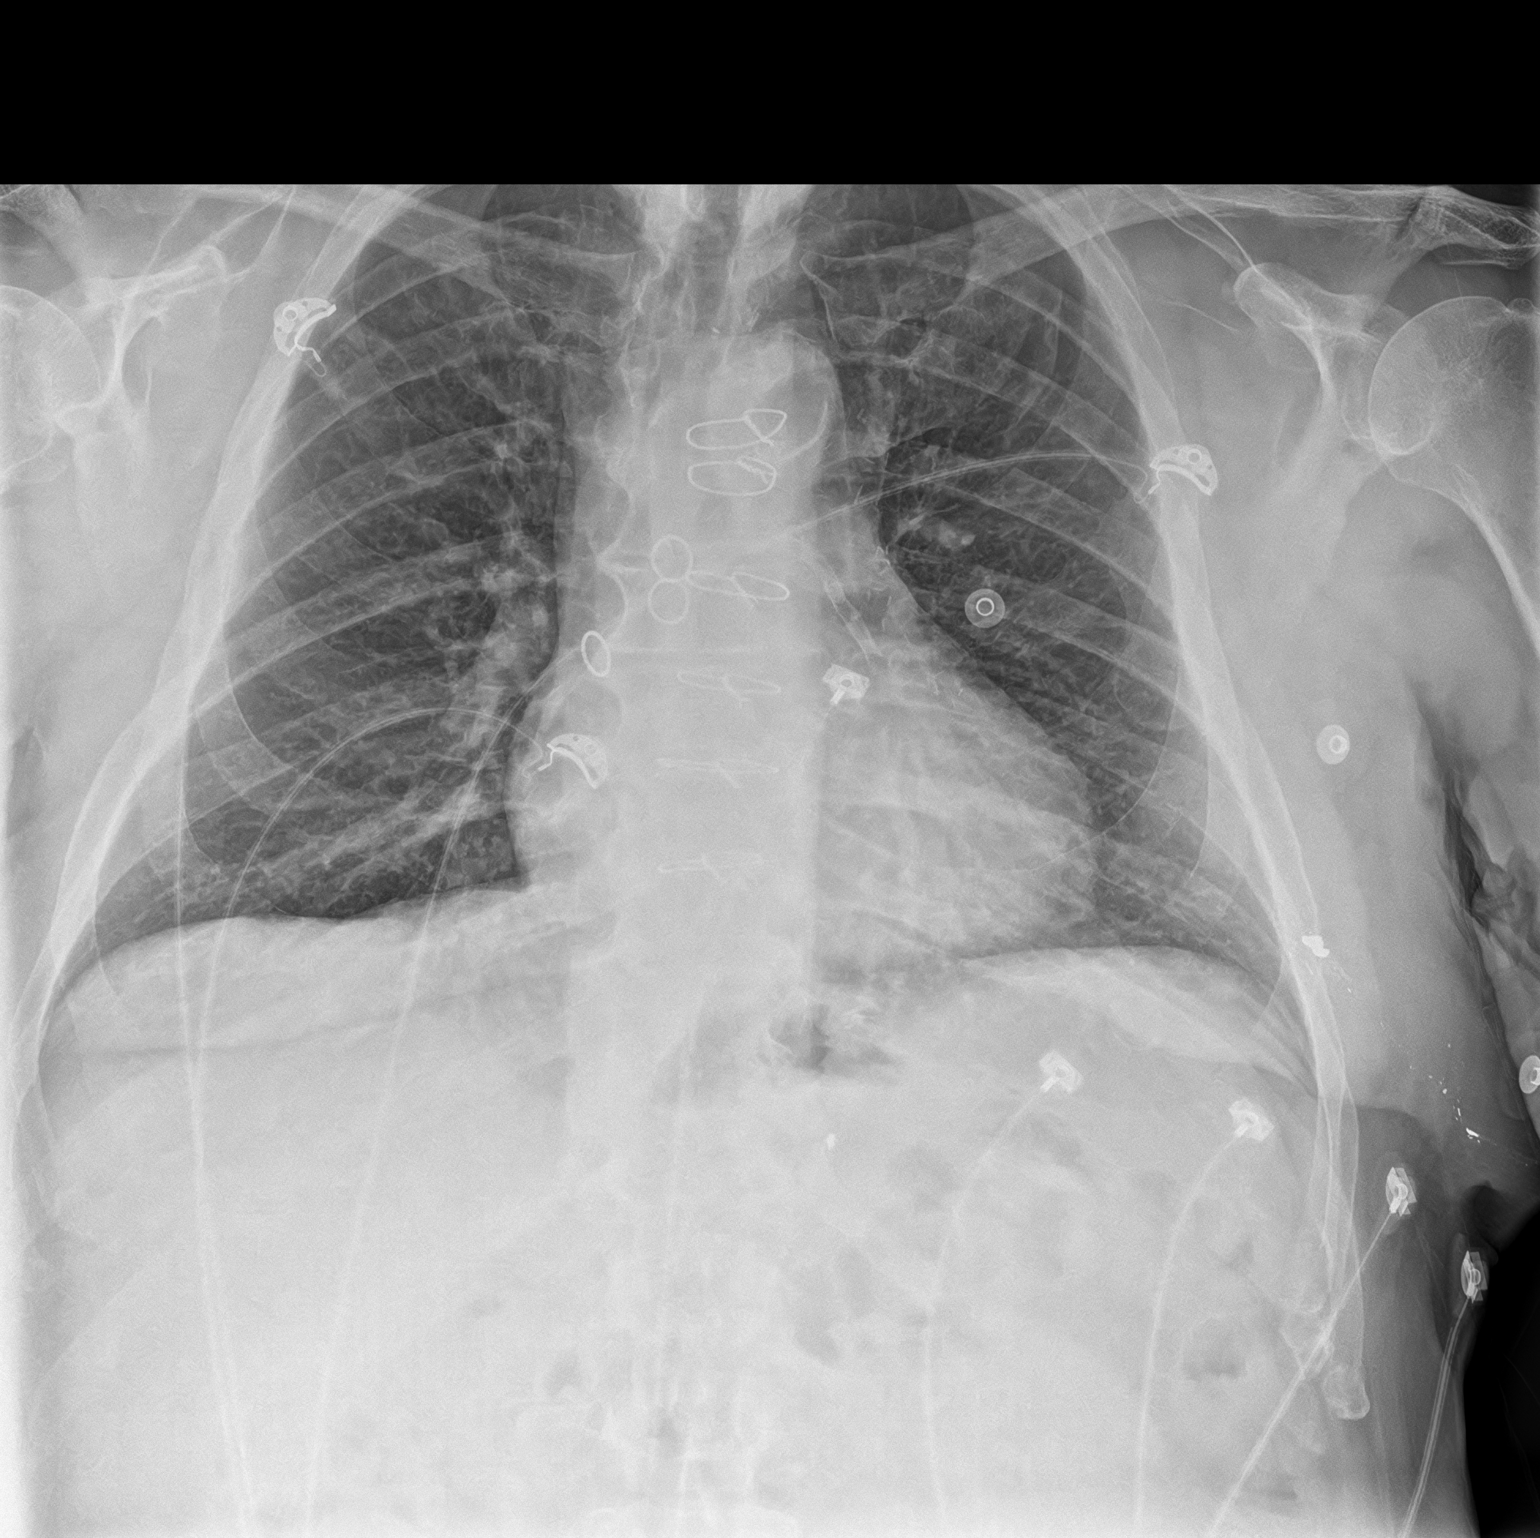

[chest lat]
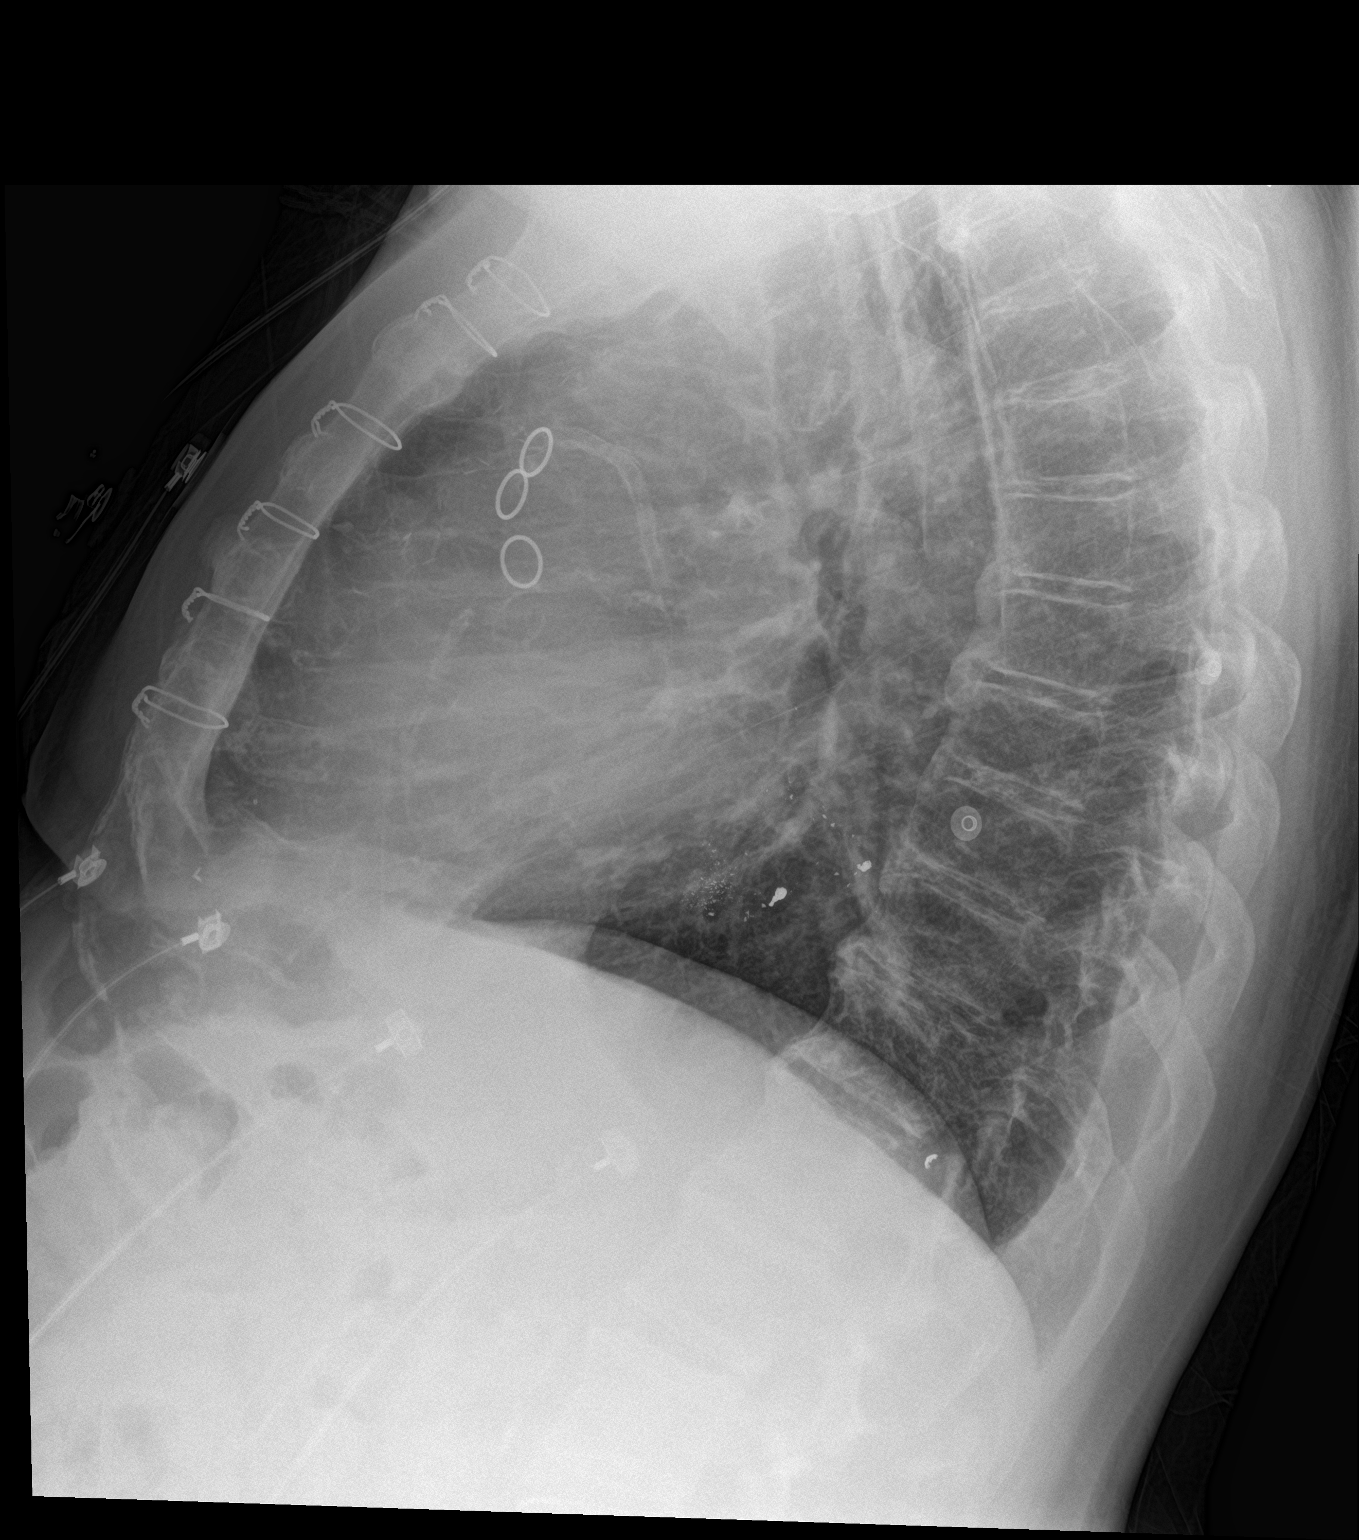

[2 of 2 positions shown; findings below may reference images not displayed]

FINDINGS: The heart size and mediastinal contours are within normal limits.
Aortic atherosclerotic calcification incidentally noted. Coronary
stent in prior CABG noted. Both lungs are clear. The visualized
skeletal structures are unremarkable.
IMPRESSION: No active cardiopulmonary disease.

## 2023-07-07 ENCOUNTER — Ambulatory Visit (INDEPENDENT_AMBULATORY_CARE_PROVIDER_SITE_OTHER): Payer: Medicare Other

## 2023-07-07 ENCOUNTER — Encounter (INDEPENDENT_AMBULATORY_CARE_PROVIDER_SITE_OTHER): Payer: Self-pay | Admitting: Nurse Practitioner

## 2023-07-07 ENCOUNTER — Ambulatory Visit (INDEPENDENT_AMBULATORY_CARE_PROVIDER_SITE_OTHER): Payer: Medicare Other | Admitting: Nurse Practitioner

## 2023-07-07 VITALS — BP 133/67 | HR 58 | Resp 18 | Ht 68.5 in | Wt 236.8 lb

## 2023-07-07 DIAGNOSIS — I152 Hypertension secondary to endocrine disorders: Secondary | ICD-10-CM | POA: Diagnosis not present

## 2023-07-07 DIAGNOSIS — E1159 Type 2 diabetes mellitus with other circulatory complications: Secondary | ICD-10-CM

## 2023-07-07 DIAGNOSIS — I63239 Cerebral infarction due to unspecified occlusion or stenosis of unspecified carotid arteries: Secondary | ICD-10-CM
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
## 2023-07-26 ENCOUNTER — Telehealth (INDEPENDENT_AMBULATORY_CARE_PROVIDER_SITE_OTHER): Payer: Self-pay

## 2023-07-26 NOTE — Telephone Encounter (Signed)
Lets do a visit

## 2023-07-26 NOTE — Telephone Encounter (Signed)
I called the patient to clarify the symptoms and based on his description, I suspect he's having neuropathic pain but we can bring him in for ABIs to make sure there is no issue with perfusion

## 2023-07-27 ENCOUNTER — Other Ambulatory Visit (INDEPENDENT_AMBULATORY_CARE_PROVIDER_SITE_OTHER): Payer: Self-pay | Admitting: Nurse Practitioner

## 2023-07-27 DIAGNOSIS — M79671 Pain in right foot: Secondary | ICD-10-CM

## 2023-07-28 ENCOUNTER — Ambulatory Visit (INDEPENDENT_AMBULATORY_CARE_PROVIDER_SITE_OTHER): Payer: Medicare Other | Admitting: Nurse Practitioner

## 2023-07-28 ENCOUNTER — Ambulatory Visit (INDEPENDENT_AMBULATORY_CARE_PROVIDER_SITE_OTHER): Payer: Medicare Other

## 2023-07-28 ENCOUNTER — Encounter (INDEPENDENT_AMBULATORY_CARE_PROVIDER_SITE_OTHER): Payer: Self-pay | Admitting: Nurse Practitioner

## 2023-07-28 VITALS — BP 131/61 | HR 60 | Resp 16 | Wt 234.4 lb

## 2023-07-28 DIAGNOSIS — I6523 Occlusion and stenosis of bilateral carotid arteries: Secondary | ICD-10-CM

## 2023-07-28 DIAGNOSIS — M79671 Pain in right foot: Secondary | ICD-10-CM | POA: Diagnosis not present

## 2023-07-28 DIAGNOSIS — I152 Hypertension secondary to endocrine disorders: Secondary | ICD-10-CM | POA: Diagnosis not present

## 2023-07-28 DIAGNOSIS — E1159 Type 2 diabetes mellitus with other circulatory complications: Secondary | ICD-10-CM
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
## 2023-11-15 ENCOUNTER — Telehealth (INDEPENDENT_AMBULATORY_CARE_PROVIDER_SITE_OTHER): Payer: Self-pay

## 2023-11-15 NOTE — Telephone Encounter (Signed)
Patient left a message stating that he has discomfort from navel moving upward occasionally at night for the past 2 months. He describes the feeling like he going to die or his breathe is being taken away. Patient reach out to PCP and was advise to contact cardiologist. Patient has appointment with cardiologist on tomorrow. Patient requesting advice from a vascular standpoint.

## 2023-11-16 NOTE — Telephone Encounter (Signed)
Patient notified with medical recommendations and verbal understanding

## 2024-01-07 ENCOUNTER — Encounter (INDEPENDENT_AMBULATORY_CARE_PROVIDER_SITE_OTHER): Payer: Medicare Other

## 2024-01-07 ENCOUNTER — Ambulatory Visit (INDEPENDENT_AMBULATORY_CARE_PROVIDER_SITE_OTHER): Payer: 59 | Admitting: Vascular Surgery

## 2024-02-09 ENCOUNTER — Other Ambulatory Visit (INDEPENDENT_AMBULATORY_CARE_PROVIDER_SITE_OTHER): Payer: Self-pay | Admitting: Nurse Practitioner

## 2024-02-09 DIAGNOSIS — I6523 Occlusion and stenosis of bilateral carotid arteries: Secondary | ICD-10-CM

## 2024-02-11 ENCOUNTER — Encounter (INDEPENDENT_AMBULATORY_CARE_PROVIDER_SITE_OTHER): Payer: Self-pay | Admitting: Vascular Surgery

## 2024-02-11 ENCOUNTER — Ambulatory Visit (INDEPENDENT_AMBULATORY_CARE_PROVIDER_SITE_OTHER): Payer: Medicare Other

## 2024-02-11 ENCOUNTER — Ambulatory Visit (INDEPENDENT_AMBULATORY_CARE_PROVIDER_SITE_OTHER): Payer: Medicare Other | Admitting: Vascular Surgery

## 2024-02-11 VITALS — BP 143/66 | HR 63 | Resp 16 | Wt 230.0 lb

## 2024-02-11 DIAGNOSIS — I63239 Cerebral infarction due to unspecified occlusion or stenosis of unspecified carotid arteries: Secondary | ICD-10-CM | POA: Diagnosis not present

## 2024-02-11 DIAGNOSIS — I152 Hypertension secondary to endocrine disorders: Secondary | ICD-10-CM | POA: Diagnosis not present

## 2024-02-11 DIAGNOSIS — E1159 Type 2 diabetes mellitus with other circulatory complications: Secondary | ICD-10-CM | POA: Diagnosis not present

## 2024-02-11 DIAGNOSIS — I6523 Occlusion and stenosis of bilateral carotid arteries: Secondary | ICD-10-CM

## 2024-02-11 NOTE — Assessment & Plan Note (Signed)
blood glucose control important in reducing the progression of atherosclerotic disease. Also, involved in wound healing. On appropriate medications.

## 2024-02-11 NOTE — Assessment & Plan Note (Signed)
Carotid duplex today show mildly elevated velocities within both carotid stents without hemodynamically significant stenosis.  Continue Crestor, aspirin, and Plavix.  Follow-up in 6 months with noninvasive studies.
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
## 2024-02-11 NOTE — Assessment & Plan Note (Signed)
blood pressure control important in reducing the progression of atherosclerotic disease. On appropriate oral medications.

## 2024-02-20 ENCOUNTER — Other Ambulatory Visit: Payer: Self-pay | Admitting: Urology

## 2024-05-16 ENCOUNTER — Encounter (INDEPENDENT_AMBULATORY_CARE_PROVIDER_SITE_OTHER): Payer: Self-pay

## 2024-06-26 ENCOUNTER — Other Ambulatory Visit: Payer: Self-pay | Admitting: Urology

## 2024-08-10 ENCOUNTER — Ambulatory Visit (INDEPENDENT_AMBULATORY_CARE_PROVIDER_SITE_OTHER): Payer: 59 | Admitting: Nurse Practitioner

## 2024-08-10 ENCOUNTER — Ambulatory Visit (INDEPENDENT_AMBULATORY_CARE_PROVIDER_SITE_OTHER): Payer: Medicare Other

## 2024-08-10 ENCOUNTER — Encounter (INDEPENDENT_AMBULATORY_CARE_PROVIDER_SITE_OTHER): Payer: Self-pay | Admitting: Nurse Practitioner

## 2024-08-10 VITALS — BP 137/70 | HR 57 | Resp 18 | Wt 222.0 lb

## 2024-08-10 DIAGNOSIS — I63239 Cerebral infarction due to unspecified occlusion or stenosis of unspecified carotid arteries: Secondary | ICD-10-CM | POA: Diagnosis not present

## 2024-08-10 DIAGNOSIS — E1159 Type 2 diabetes mellitus with other circulatory complications: Secondary | ICD-10-CM

## 2024-08-10 DIAGNOSIS — I6523 Occlusion and stenosis of bilateral carotid arteries: Secondary | ICD-10-CM

## 2024-08-10 DIAGNOSIS — I152 Hypertension secondary to endocrine disorders: Secondary | ICD-10-CM

## 2024-08-13 ENCOUNTER — Encounter (INDEPENDENT_AMBULATORY_CARE_PROVIDER_SITE_OTHER): Payer: Self-pay | Admitting: Nurse Practitioner
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
## 2024-12-12 ENCOUNTER — Emergency Department

## 2024-12-12 ENCOUNTER — Encounter: Payer: Self-pay | Admitting: Emergency Medicine

## 2024-12-12 ENCOUNTER — Inpatient Hospital Stay
Admission: EM | Admit: 2024-12-12 | Discharge: 2024-12-14 | DRG: 194 | Disposition: A | Discharge status: Home / Self Care | Attending: Internal Medicine | Admitting: Internal Medicine

## 2024-12-12 ENCOUNTER — Other Ambulatory Visit: Payer: Self-pay

## 2024-12-12 DIAGNOSIS — Z7902 Long term (current) use of antithrombotics/antiplatelets: Secondary | ICD-10-CM

## 2024-12-12 DIAGNOSIS — Z833 Family history of diabetes mellitus: Secondary | ICD-10-CM

## 2024-12-12 DIAGNOSIS — Z7982 Long term (current) use of aspirin: Secondary | ICD-10-CM

## 2024-12-12 DIAGNOSIS — N401 Enlarged prostate with lower urinary tract symptoms: Secondary | ICD-10-CM | POA: Diagnosis not present

## 2024-12-12 DIAGNOSIS — E1159 Type 2 diabetes mellitus with other circulatory complications: Secondary | ICD-10-CM | POA: Diagnosis present

## 2024-12-12 DIAGNOSIS — N400 Enlarged prostate without lower urinary tract symptoms: Secondary | ICD-10-CM | POA: Diagnosis present

## 2024-12-12 DIAGNOSIS — J189 Pneumonia, unspecified organism: Principal | ICD-10-CM | POA: Diagnosis present

## 2024-12-12 DIAGNOSIS — Z79899 Other long term (current) drug therapy: Secondary | ICD-10-CM

## 2024-12-12 DIAGNOSIS — Z955 Presence of coronary angioplasty implant and graft: Secondary | ICD-10-CM

## 2024-12-12 DIAGNOSIS — N179 Acute kidney failure, unspecified: Secondary | ICD-10-CM

## 2024-12-12 DIAGNOSIS — E1129 Type 2 diabetes mellitus with other diabetic kidney complication: Secondary | ICD-10-CM | POA: Diagnosis present

## 2024-12-12 DIAGNOSIS — N1832 Chronic kidney disease, stage 3b: Secondary | ICD-10-CM | POA: Diagnosis present

## 2024-12-12 DIAGNOSIS — E785 Hyperlipidemia, unspecified: Secondary | ICD-10-CM | POA: Diagnosis present

## 2024-12-12 DIAGNOSIS — G2581 Restless legs syndrome: Secondary | ICD-10-CM | POA: Diagnosis present

## 2024-12-12 DIAGNOSIS — Z91119 Patient's noncompliance with dietary regimen due to unspecified reason: Secondary | ICD-10-CM

## 2024-12-12 DIAGNOSIS — I152 Hypertension secondary to endocrine disorders: Secondary | ICD-10-CM | POA: Diagnosis present

## 2024-12-12 DIAGNOSIS — K229 Disease of esophagus, unspecified: Secondary | ICD-10-CM | POA: Diagnosis present

## 2024-12-12 DIAGNOSIS — Z951 Presence of aortocoronary bypass graft: Secondary | ICD-10-CM | POA: Diagnosis not present

## 2024-12-12 DIAGNOSIS — Z860100 Personal history of colon polyps, unspecified: Secondary | ICD-10-CM

## 2024-12-12 DIAGNOSIS — Z888 Allergy status to other drugs, medicaments and biological substances status: Secondary | ICD-10-CM

## 2024-12-12 DIAGNOSIS — Z6835 Body mass index (BMI) 35.0-35.9, adult: Secondary | ICD-10-CM

## 2024-12-12 DIAGNOSIS — I5032 Chronic diastolic (congestive) heart failure: Secondary | ICD-10-CM | POA: Diagnosis present

## 2024-12-12 DIAGNOSIS — Z885 Allergy status to narcotic agent status: Secondary | ICD-10-CM

## 2024-12-12 DIAGNOSIS — Z886 Allergy status to analgesic agent status: Secondary | ICD-10-CM

## 2024-12-12 DIAGNOSIS — I6523 Occlusion and stenosis of bilateral carotid arteries: Secondary | ICD-10-CM | POA: Diagnosis present

## 2024-12-12 DIAGNOSIS — E1122 Type 2 diabetes mellitus with diabetic chronic kidney disease: Secondary | ICD-10-CM | POA: Diagnosis present

## 2024-12-12 DIAGNOSIS — I130 Hypertensive heart and chronic kidney disease with heart failure and stage 1 through stage 4 chronic kidney disease, or unspecified chronic kidney disease: Secondary | ICD-10-CM | POA: Diagnosis present

## 2024-12-12 DIAGNOSIS — Z7984 Long term (current) use of oral hypoglycemic drugs: Secondary | ICD-10-CM | POA: Diagnosis not present

## 2024-12-12 DIAGNOSIS — R9431 Abnormal electrocardiogram [ECG] [EKG]: Secondary | ICD-10-CM | POA: Diagnosis present

## 2024-12-12 DIAGNOSIS — I2510 Atherosclerotic heart disease of native coronary artery without angina pectoris: Secondary | ICD-10-CM | POA: Diagnosis present

## 2024-12-12 DIAGNOSIS — Z720 Tobacco use: Secondary | ICD-10-CM

## 2024-12-12 DIAGNOSIS — E860 Dehydration: Secondary | ICD-10-CM | POA: Diagnosis present

## 2024-12-12 DIAGNOSIS — Z794 Long term (current) use of insulin: Secondary | ICD-10-CM | POA: Diagnosis not present

## 2024-12-12 DIAGNOSIS — E669 Obesity, unspecified: Secondary | ICD-10-CM | POA: Diagnosis present

## 2024-12-12 DIAGNOSIS — Z1152 Encounter for screening for COVID-19: Secondary | ICD-10-CM | POA: Diagnosis not present

## 2024-12-12 DIAGNOSIS — T465X5A Adverse effect of other antihypertensive drugs, initial encounter: Secondary | ICD-10-CM | POA: Diagnosis present

## 2024-12-12 LAB — RESP PANEL BY RT-PCR (RSV, FLU A&B, COVID)  RVPGX2
Influenza A by PCR: NEGATIVE
Influenza B by PCR: NEGATIVE
Resp Syncytial Virus by PCR: NEGATIVE
SARS Coronavirus 2 by RT PCR: NEGATIVE

## 2024-12-12 LAB — URINALYSIS, ROUTINE W REFLEX MICROSCOPIC
Bilirubin Urine: NEGATIVE
Glucose, UA: >=500 mg/dL — AB
Ketones, ur: NEGATIVE mg/dL
Leukocytes,Ua: NEGATIVE
Nitrite: NEGATIVE
Protein, ur: 100 mg/dL — AB
Specific Gravity, Urine: 1.028 (ref 1.005–1.030)
pH: 5 (ref 5.0–8.0)

## 2024-12-12 LAB — COMPREHENSIVE METABOLIC PANEL WITH GFR
ALT: 42 U/L (ref 0–44)
AST: 62 U/L — ABNORMAL HIGH (ref 15–41)
Albumin: 3.9 g/dL (ref 3.5–5.0)
Alkaline Phosphatase: 50 U/L (ref 38–126)
Anion gap: 14 (ref 5–15)
BUN: 31 mg/dL — ABNORMAL HIGH (ref 8–23)
CO2: 23 mmol/L (ref 22–32)
Calcium: 9.1 mg/dL (ref 8.9–10.3)
Chloride: 95 mmol/L — ABNORMAL LOW (ref 98–111)
Creatinine, Ser: 2.01 mg/dL — ABNORMAL HIGH (ref 0.61–1.24)
GFR, Estimated: 35 mL/min — ABNORMAL LOW (ref >60)
Glucose, Bld: 213 mg/dL — ABNORMAL HIGH (ref 70–99)
Potassium: 4.1 mmol/L (ref 3.5–5.1)
Sodium: 132 mmol/L — ABNORMAL LOW (ref 135–145)
Total Bilirubin: 1.1 mg/dL (ref 0.0–1.2)
Total Protein: 6.8 g/dL (ref 6.5–8.1)

## 2024-12-12 LAB — CBC
HCT: 40.6 % (ref 39.0–52.0)
Hemoglobin: 13.4 g/dL (ref 13.0–17.0)
MCH: 27.6 pg (ref 26.0–34.0)
MCHC: 33 g/dL (ref 30.0–36.0)
MCV: 83.7 fL (ref 80.0–100.0)
Platelets: 173 K/uL (ref 150–400)
RBC: 4.85 MIL/uL (ref 4.22–5.81)
RDW: 14.6 % (ref 11.5–15.5)
WBC: 13.3 K/uL — ABNORMAL HIGH (ref 4.0–10.5)
nRBC: 0 % (ref 0.0–0.2)

## 2024-12-12 LAB — CBG MONITORING, ED: Glucose-Capillary: 182 mg/dL — ABNORMAL HIGH (ref 70–99)

## 2024-12-12 LAB — GLUCOSE, CAPILLARY: Glucose-Capillary: 182 mg/dL — ABNORMAL HIGH (ref 70–99)

## 2024-12-12 LAB — PRO BRAIN NATRIURETIC PEPTIDE: Pro Brain Natriuretic Peptide: 2301 pg/mL — ABNORMAL HIGH (ref <300.0)

## 2024-12-12 LAB — LACTIC ACID, PLASMA: Lactic Acid, Venous: 1.5 mmol/L (ref 0.5–1.9)

## 2024-12-12 LAB — LIPASE, BLOOD: Lipase: 13 U/L (ref 11–51)

## 2024-12-12 MED ORDER — DIPHENHYDRAMINE HCL 50 MG/ML IJ SOLN: 12.5000 mg | Freq: Three times a day (TID) | INTRAMUSCULAR | Status: DC | PRN

## 2024-12-12 MED ORDER — NITROGLYCERIN 0.4 MG SL SUBL: 0.4000 mg | SUBLINGUAL_TABLET | SUBLINGUAL | Status: DC | PRN

## 2024-12-12 MED ORDER — GABAPENTIN 300 MG PO CAPS
300.0000 mg | ORAL_CAPSULE | Freq: Four times a day (QID) | ORAL | Status: DC
  Administered 2024-12-13 – 2024-12-14 (×5): 300 mg via ORAL
  Filled 2024-12-12 (×6): qty 1

## 2024-12-12 MED ORDER — DM-GUAIFENESIN ER 30-600 MG PO TB12: 1.0000 | ORAL_TABLET | Freq: Two times a day (BID) | ORAL | Status: AC | PRN

## 2024-12-12 MED ORDER — SODIUM CHLORIDE 0.9 % IV BOLUS
1000.0000 mL | Freq: Once | INTRAVENOUS | Status: AC
  Administered 2024-12-12: 20:00:00 1000 mL via INTRAVENOUS

## 2024-12-12 MED ORDER — SODIUM CHLORIDE 0.9 % IV SOLN: INTRAVENOUS | Status: DC

## 2024-12-12 MED ORDER — INSULIN ASPART 100 UNIT/ML IJ SOLN
0.0000 [IU] | Freq: Three times a day (TID) | INTRAMUSCULAR | Status: DC
  Administered 2024-12-13: 16:00:00 3 [IU] via SUBCUTANEOUS
  Administered 2024-12-13: 18:00:00 5 [IU] via SUBCUTANEOUS
  Administered 2024-12-13: 11:00:00 1 [IU] via SUBCUTANEOUS
  Administered 2024-12-14: 09:00:00 2 [IU] via SUBCUTANEOUS
  Filled 2024-12-12: qty 3
  Filled 2024-12-12: qty 2
  Filled 2024-12-12: qty 3
  Filled 2024-12-12: qty 1
  Filled 2024-12-12: qty 5

## 2024-12-12 MED ORDER — ALBUTEROL SULFATE (2.5 MG/3ML) 0.083% IN NEBU: 2.5000 mg | INHALATION_SOLUTION | RESPIRATORY_TRACT | Status: DC | PRN

## 2024-12-12 MED ORDER — ONDANSETRON HCL 4 MG/2ML IJ SOLN: 4.0000 mg | Freq: Three times a day (TID) | INTRAMUSCULAR | Status: DC | PRN

## 2024-12-12 MED ORDER — CLOPIDOGREL BISULFATE 75 MG PO TABS
75.0000 mg | ORAL_TABLET | Freq: Every day | ORAL | Status: DC
  Administered 2024-12-13 – 2024-12-14 (×2): 75 mg via ORAL
  Filled 2024-12-12 (×2): qty 1

## 2024-12-12 MED ORDER — SODIUM CHLORIDE 0.9 % IV SOLN: 500.0000 mg | INTRAVENOUS | Status: DC

## 2024-12-12 MED ORDER — SODIUM CHLORIDE 0.9 % IV SOLN
100.0000 mg | Freq: Two times a day (BID) | INTRAVENOUS | Status: DC
  Administered 2024-12-13 – 2024-12-14 (×3): 100 mg via INTRAVENOUS
  Filled 2024-12-12 (×3): qty 100

## 2024-12-12 MED ORDER — ONDANSETRON HCL 4 MG/2ML IJ SOLN
4.0000 mg | Freq: Once | INTRAMUSCULAR | Status: AC
  Administered 2024-12-12: 20:00:00 4 mg via INTRAVENOUS
  Filled 2024-12-12: qty 2

## 2024-12-12 MED ORDER — PANTOPRAZOLE SODIUM 40 MG IV SOLR
40.0000 mg | Freq: Once | INTRAVENOUS | Status: AC
  Administered 2024-12-12: 20:00:00 40 mg via INTRAVENOUS
  Filled 2024-12-12: qty 10

## 2024-12-12 MED ORDER — HYDRALAZINE HCL 20 MG/ML IJ SOLN: 5.0000 mg | INTRAMUSCULAR | Status: DC | PRN

## 2024-12-12 MED ORDER — INFLUENZA VAC SPLIT HIGH-DOSE 0.5 ML IM SUSY
0.5000 mL | PREFILLED_SYRINGE | INTRAMUSCULAR | Status: DC
  Filled 2024-12-12: qty 0.5

## 2024-12-12 MED ORDER — ACETAMINOPHEN 325 MG PO TABS
650.0000 mg | ORAL_TABLET | Freq: Four times a day (QID) | ORAL | Status: DC | PRN
  Administered 2024-12-13 (×2): 650 mg via ORAL
  Filled 2024-12-12 (×2): qty 2

## 2024-12-12 MED ORDER — DOXAZOSIN MESYLATE 2 MG PO TABS
2.0000 mg | ORAL_TABLET | Freq: Every day | ORAL | Status: DC
  Administered 2024-12-13 – 2024-12-14 (×2): 2 mg via ORAL
  Filled 2024-12-12 (×2): qty 1

## 2024-12-12 MED ORDER — SODIUM CHLORIDE 0.9 % IV SOLN
500.0000 mg | Freq: Once | INTRAVENOUS | Status: AC
  Administered 2024-12-12: 22:00:00 500 mg via INTRAVENOUS
  Filled 2024-12-12: qty 5

## 2024-12-12 MED ORDER — PANTOPRAZOLE SODIUM 40 MG PO TBEC
40.0000 mg | DELAYED_RELEASE_TABLET | Freq: Every day | ORAL | Status: DC
  Administered 2024-12-13 – 2024-12-14 (×2): 40 mg via ORAL
  Filled 2024-12-12 (×2): qty 1

## 2024-12-12 MED ORDER — ACETAMINOPHEN 500 MG PO TABS
1000.0000 mg | ORAL_TABLET | Freq: Once | ORAL | Status: AC
  Administered 2024-12-12: 19:00:00 1000 mg via ORAL
  Filled 2024-12-12: qty 2

## 2024-12-12 MED ORDER — ROSUVASTATIN CALCIUM 20 MG PO TABS
20.0000 mg | ORAL_TABLET | Freq: Every day | ORAL | Status: DC
  Administered 2024-12-13: 11:00:00 20 mg via ORAL
  Filled 2024-12-12: qty 1

## 2024-12-12 MED ORDER — VITAMIN B-12 1000 MCG PO TABS
1000.0000 ug | ORAL_TABLET | Freq: Every day | ORAL | Status: DC
  Administered 2024-12-13 – 2024-12-14 (×2): 1000 ug via ORAL
  Filled 2024-12-12 (×2): qty 1

## 2024-12-12 MED ORDER — INSULIN ASPART 100 UNIT/ML IJ SOLN: 0.0000 [IU] | Freq: Every day | INTRAMUSCULAR | Status: DC

## 2024-12-12 MED ORDER — METOPROLOL TARTRATE 25 MG PO TABS
25.0000 mg | ORAL_TABLET | Freq: Two times a day (BID) | ORAL | Status: DC
  Filled 2024-12-12: qty 1

## 2024-12-12 MED ORDER — FERROUS SULFATE 325 (65 FE) MG PO TBEC
325.0000 mg | ORAL_TABLET | Freq: Every day | ORAL | Status: DC
  Administered 2024-12-13 – 2024-12-14 (×2): 325 mg via ORAL
  Filled 2024-12-12 (×2): qty 1

## 2024-12-12 MED ORDER — ASPIRIN 81 MG PO TBEC
81.0000 mg | DELAYED_RELEASE_TABLET | Freq: Every day | ORAL | Status: DC
  Administered 2024-12-13 – 2024-12-14 (×2): 81 mg via ORAL
  Filled 2024-12-12 (×2): qty 1

## 2024-12-12 MED ORDER — SODIUM CHLORIDE 0.9 % IV SOLN
2.0000 g | INTRAVENOUS | Status: DC
  Administered 2024-12-13: 22:00:00 2 g via INTRAVENOUS
  Filled 2024-12-12 (×2): qty 20

## 2024-12-12 MED ORDER — ENOXAPARIN SODIUM 60 MG/0.6ML IJ SOSY
50.0000 mg | PREFILLED_SYRINGE | INTRAMUSCULAR | Status: DC
  Administered 2024-12-12 – 2024-12-13 (×2): 50 mg via SUBCUTANEOUS
  Filled 2024-12-12 (×2): qty 0.6

## 2024-12-12 MED ORDER — INSULIN GLARGINE-YFGN 100 UNIT/ML ~~LOC~~ SOPN
35.0000 [IU] | PEN_INJECTOR | Freq: Every day | SUBCUTANEOUS | Status: DC
  Administered 2024-12-13 – 2024-12-14 (×2): 35 [IU] via SUBCUTANEOUS
  Filled 2024-12-12 (×2): qty 0.35

## 2024-12-12 MED ORDER — SODIUM CHLORIDE 0.9 % IV SOLN
2.0000 g | Freq: Once | INTRAVENOUS | Status: AC
  Administered 2024-12-12: 21:00:00 2 g via INTRAVENOUS
  Filled 2024-12-12: qty 20

## 2024-12-12 NOTE — ED Triage Notes (Addendum)
 PT reports fever, chills, and vomiting that started on Saturday. Pt also reports dark colored urine and urinary incontinence. PT reports last taking tylenol  this morning.
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
## 2024-12-12 NOTE — ED Notes (Signed)
 Pt is being monitored by CCMD, aware he is moving to inpatient
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
## 2024-12-12 NOTE — ED Notes (Signed)
 Snack tray and pitcher of water provided per verbal order from [facility].D.
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
## 2024-12-13 DIAGNOSIS — I5032 Chronic diastolic (congestive) heart failure: Secondary | ICD-10-CM | POA: Diagnosis not present

## 2024-12-13 DIAGNOSIS — E1159 Type 2 diabetes mellitus with other circulatory complications: Secondary | ICD-10-CM | POA: Diagnosis not present

## 2024-12-13 DIAGNOSIS — N179 Acute kidney failure, unspecified: Secondary | ICD-10-CM | POA: Diagnosis not present

## 2024-12-13 DIAGNOSIS — I152 Hypertension secondary to endocrine disorders: Secondary | ICD-10-CM | POA: Diagnosis not present

## 2024-12-13 DIAGNOSIS — N1832 Chronic kidney disease, stage 3b: Secondary | ICD-10-CM | POA: Diagnosis not present

## 2024-12-13 DIAGNOSIS — E785 Hyperlipidemia, unspecified: Secondary | ICD-10-CM | POA: Diagnosis not present

## 2024-12-13 DIAGNOSIS — J189 Pneumonia, unspecified organism: Secondary | ICD-10-CM | POA: Diagnosis not present

## 2024-12-13 LAB — BLOOD CULTURE ID PANEL (REFLEXED) - BCID2

## 2024-12-13 LAB — COMPREHENSIVE METABOLIC PANEL WITH GFR
ALT: 38 U/L (ref 0–44)
AST: 47 U/L — ABNORMAL HIGH (ref 15–41)
Albumin: 3.3 g/dL — ABNORMAL LOW (ref 3.5–5.0)
Alkaline Phosphatase: 45 U/L (ref 38–126)
Anion gap: 11 (ref 5–15)
BUN: 30 mg/dL — ABNORMAL HIGH (ref 8–23)
CO2: 22 mmol/L (ref 22–32)
Calcium: 8.4 mg/dL — ABNORMAL LOW (ref 8.9–10.3)
Chloride: 101 mmol/L (ref 98–111)
Creatinine, Ser: 1.72 mg/dL — ABNORMAL HIGH (ref 0.61–1.24)
GFR, Estimated: 42 mL/min — ABNORMAL LOW (ref >60)
Glucose, Bld: 142 mg/dL — ABNORMAL HIGH (ref 70–99)
Potassium: 4 mmol/L (ref 3.5–5.1)
Sodium: 134 mmol/L — ABNORMAL LOW (ref 135–145)
Total Bilirubin: 0.7 mg/dL (ref 0.0–1.2)
Total Protein: 6 g/dL — ABNORMAL LOW (ref 6.5–8.1)

## 2024-12-13 LAB — MAGNESIUM: Magnesium: 2.1 mg/dL (ref 1.7–2.4)

## 2024-12-13 LAB — GLUCOSE, CAPILLARY
Glucose-Capillary: 148 mg/dL — ABNORMAL HIGH (ref 70–99)
Glucose-Capillary: 224 mg/dL — ABNORMAL HIGH (ref 70–99)
Glucose-Capillary: 269 mg/dL — ABNORMAL HIGH (ref 70–99)
Glucose-Capillary: 174 mg/dL — ABNORMAL HIGH (ref 70–99)

## 2024-12-13 LAB — PROCALCITONIN: Procalcitonin: 0.42 ng/mL

## 2024-12-13 MED ORDER — ISOSORBIDE MONONITRATE ER 30 MG PO TB24
60.0000 mg | ORAL_TABLET | Freq: Every day | ORAL | Status: DC
  Administered 2024-12-13 – 2024-12-14 (×2): 60 mg via ORAL
  Filled 2024-12-13 (×2): qty 2

## 2024-12-13 MED ORDER — METOPROLOL TARTRATE 25 MG PO TABS
25.0000 mg | ORAL_TABLET | Freq: Two times a day (BID) | ORAL | Status: DC
  Administered 2024-12-13 – 2024-12-14 (×2): 25 mg via ORAL
  Filled 2024-12-13 (×2): qty 1

## 2024-12-13 MED ORDER — DAPAGLIFLOZIN PROPANEDIOL 10 MG PO TABS
10.0000 mg | ORAL_TABLET | Freq: Every day | ORAL | Status: DC
  Administered 2024-12-13 – 2024-12-14 (×2): 10 mg via ORAL
  Filled 2024-12-13 (×2): qty 1

## 2024-12-13 MED ORDER — TAMSULOSIN HCL 0.4 MG PO CAPS
0.4000 mg | ORAL_CAPSULE | Freq: Every day | ORAL | Status: DC
  Administered 2024-12-13 – 2024-12-14 (×2): 0.4 mg via ORAL
  Filled 2024-12-13 (×2): qty 1

## 2024-12-13 MED ORDER — ROSUVASTATIN CALCIUM 20 MG PO TABS: 40.0000 mg | ORAL_TABLET | Freq: Every day | ORAL | Status: DC

## 2024-12-13 MED ORDER — AMLODIPINE BESYLATE 10 MG PO TABS
10.0000 mg | ORAL_TABLET | Freq: Every day | ORAL | Status: DC
  Administered 2024-12-13 – 2024-12-14 (×2): 10 mg via ORAL
  Filled 2024-12-13 (×2): qty 1

## 2024-12-13 NOTE — Progress Notes (Signed)
 [facility]  - [place] [facility]   PATIENT NAME: Bob Miller    MR#:  [id number]  DATE OF BIRTH:  11/02/1951  SUBJECTIVE:  wife at bedside. Patient sitting up in the chair. Overall feels a lot better. On room air. Tolerating PO diet well. No fee had fever of 102 at 2 AM. Patient feels fine today.    VITALS:  Blood pressure (!) 143/66, pulse 96, temperature 100.3 F (37.9 C), resp. rate 18, height 5' 9 (1.753 m), weight 108.8 kg, SpO2 96%.  PHYSICAL EXAMINATION:   GENERAL:  73 y.o.-year-old patient with no acute distress.  LUNGS: Normal breath sounds bilaterally, no wheezing CARDIOVASCULAR: S1, S2 normal. No murmur   ABDOMEN: Soft, nontender, nondistended. Bowel sounds present.  EXTREMITIES: No  edema b/l.    NEUROLOGIC: nonfocal  patient is alert and awake SKIN: No obvious rash, lesion, or ulcer.   LABORATORY PANEL:  CBC Recent Labs  Lab 12/12/24 1706  WBC 13.3*  HGB 13.4  HCT 40.6  PLT 173    Chemistries  Recent Labs  Lab 12/12/24 2340 12/13/24 0632  NA  --  134*  K  --  4.0  CL  --  101  CO2  --  22  GLUCOSE  --  142*  BUN  --  30*  CREATININE  --  1.72*  CALCIUM   --  8.4*  MG 2.1  --   AST  --  47*  ALT  --  38  ALKPHOS  --  45  BILITOT  --  0.7   Cardiac Enzymes No results for input(s): TROPONINI in the last 168 hours. RADIOLOGY:  CT Chest Wo Contrast Result Date: 12/12/2024 EXAM: CT CHEST WITHOUT CONTRAST 12/12/2024 09:20:52 PM TECHNIQUE: CT of the chest was performed without the administration of intravenous contrast. Multiplanar reformatted images are provided for review. Automated exposure control, iterative reconstruction, and/or weight based adjustment of the mA/kV was utilized to reduce the radiation dose to as low as reasonably achievable. COMPARISON: Chest x-Bakken from earlier in the same day. CLINICAL HISTORY: Abnormal chest x-Schmelzer; evaluate for pneumonia. FINDINGS: MEDIASTINUM: Heart: No cardiac enlargement is seen. Heavy  coronary calcifications and changes of prior coronary bypass graft are seen. Pericardium is unremarkable. The central airways are clear. The thoracic aorta shows diffuse atherosclerotic calcifications. No aneurysmal dilatation of the aorta is noted. The esophagus demonstrates mild wall thickening, likely related to reflux. LYMPH NODES: No mediastinal, hilar or axillary lymphadenopathy. LUNGS AND PLEURA: Lungs are well aerated bilaterally. Focal infiltrate is noted in the posterior right upper lobe consistent with acute pneumonia. That corresponds to that seen on prior plain film examination. No pulmonary edema. No pleural effusion or pneumothorax. SOFT TISSUES/BONES: No acute abnormality of the bones or soft tissues. UPPER ABDOMEN: Limited images of the upper abdomen demonstrates no acute abnormality. IMPRESSION: 1. Acute pneumonia in the posterior right upper lobe, corresponding to prior plain film examination. Electronically signed by: Oneil Devonshire MD 12/12/2024 09:26 PM EST RP Workstation: [id number]   DG Chest Portable 1 View Result Date: 12/12/2024 CLINICAL DATA:  fever, fatigue, nausea EXAM: PORTABLE CHEST - 1 VIEW COMPARISON:  October 30, 2022 FINDINGS: Hazy masslike consolidation along the right lateral mid lung. No pleural effusion or pneumothorax. No cardiomegaly. Sternotomy wires and CABG markers. Cardiac stent noted. Aortic atherosclerosis. No acute fracture or destructive lesions. Multilevel thoracic osteophytosis. IMPRESSION: Hazy masslike consolidation along the lateral right mid lung, worrisome for pneumonia in the correct clinical context. Nonemergent chest CT  with IV contrast may be of benefit to exclude underlying lung mass. Electronically Signed   By: Rogelia Myers M.D.   On: 12/12/2024 19:17   CT ABDOMEN PELVIS WO CONTRAST Result Date: 12/12/2024 EXAM: CT ABDOMEN AND PELVIS WITHOUT CONTRAST 12/12/2024 07:02:49 PM TECHNIQUE: CT of the abdomen and pelvis was performed without the  administration of intravenous contrast. Multiplanar reformatted images are provided for review. Automated exposure control, iterative reconstruction, and/or weight-based adjustment of the mA/kV was utilized to reduce the radiation dose to as low as reasonably achievable. COMPARISON: CT chest abdomen and pelvis 05/17/2022. CLINICAL HISTORY: Abdominal pain, acute, nonlocalized. FINDINGS: LOWER CHEST: There are trace bilateral pleural effusions. LIVER: The liver is unremarkable. GALLBLADDER AND BILE DUCTS: Gallbladder is unremarkable. No biliary ductal dilatation. SPLEEN: Periventricular calcified cyst is seen in the spleen measuring 1 cm, unchanged. Splenules are present. PANCREAS: No acute abnormality. ADRENAL GLANDS: No acute abnormality. KIDNEYS, URETERS AND BLADDER: There is a single punctate calculus in the left kidney. There is mild nonspecific bilateral perinephric fat stranding which is similar to prior. There are likely small left peripelvic cysts. No hydronephrosis. No perinephric or periureteral stranding. Urinary bladder is unremarkable. GI AND BOWEL: Stomach demonstrates no acute abnormality. There is a small hiatal hernia. There is wall thickening of the distal esophagus. The appendix appears normal. There is no bowel obstruction. PERITONEUM AND RETROPERITONEUM: No ascites. No free air. VASCULATURE: Aorta is normal in caliber. There are atherosclerotic calcifications of the aorta and iliac arteries. LYMPH NODES: No lymphadenopathy. REPRODUCTIVE ORGANS: Prostate gland is mildly enlarged. BONES AND SOFT TISSUES: No acute osseous abnormality. There are small fat containing bilateral inguinal hernias. IMPRESSION: 1. No acute findings in the abdomen or pelvis. 2. Small hiatal hernia with distal esophageal wall thickening; consider endoscopic evaluation. 3. Punctate nonobstructing left renal calculus. 4. Trace bilateral pleural effusions. Electronically signed by: Greig Pique MD 12/12/2024 07:14 PM EST RP  Workstation: [id number]    Assessment and Plan   Bob Miller is a 73 y.o. male with medical history significant of HTN, HLD, DM, CAD, dCHF, BPH, CKD-3b, bilateral carotid artery stenosis (s/p of stent placement), RLS, who presents with mild dry cough, fever, chills, weakness.  Patient states that he has been sick in the past 4 days, withsymptoms including mild dry cough, fever, chills, malaise, generalized weakness.   CXR: Hazy masslike consolidation along the lateral right mid lung, worrisome for pneumonia in the correct clinical context. Nonemergent chest CT with IV contrast may be of benefit to exclude underlying lung mass.   CT of abdomen/pelvis: 1. No acute findings in the abdomen or pelvis. 2. Small hiatal hernia with distal esophageal wall thickening; consider endoscopic evaluation. 3. Punctate nonobstructing left renal calculus. 4. Trace bilateral pleural effusions.   CT of chest: 1. Acute pneumonia in the posterior right upper lobe, corresponding to prior plain film examination. CAP (community acquired pneumonia): CT of chest showed acute pneumonia in the posterior right upper lobe. Pt has WBC 13.3 and fever at home with temperature 99.7 in ED. lactic acid normal 1.5.  Patient is at risk of developing sepsis.  -  Rocephin  and doxycycline  (patient received 1 dose of azithromycin  in ED. due to QT prolongation 522, switched to doxycycline ). - Mucinex  for cough  - Bronchodilators -  blood culture x2 so far neg -received IV fluids   Acute renal failure superimposed on stage 3b chronic kidney disease (HCC): Baseline creatinine 1.42.  His creatinine is at 2.01, BUN 31, GFR 35 today.  Likely due to dehydration and continuation of Cozaar . - IV fluids as above - Cozaar  is on hold -- creatinine trending down   Chronic diastolic CHF (congestive heart failure) (HCC): 2D echo on 03/08/2024 showed EF 55% with grade 1 diastolic dysfunction.  Patient has elevated BNP 2301, but no leg edema  or JVD.  Does not seem to have CHF exacerbation.  Obesity patient is at the risk of developing CHF exacerbation. -Patient appears stable on room air   CAD (coronary artery disease): -Continue aspirin , Plavix  and Crestor    Hypertension associated with diabetes (HCC) -IV hydralazine  as needed -will resume bp meds since blood pressure trending up  Bilateral carotid artery stenosis -Aspirin , Plavix , Crestor    HLD (hyperlipidemia) -Crestor    Type II diabetes mellitus with renal manifestations [facility]): Recent A1c 11.0, poorly controlled.  Patient taking metformin , Farxiga  and glargine insulin  50 units daily -SSI -- patient has been noncompliant with diet -- he tells me outpatient his A1c most recent one is 7.0   BPH (benign prostatic hyperplasia) - Cardura    Abnormal findings of CT abdomen/pelvis with esophageal wall thickening: this is an incidental findings by CT scan.  I have advised the patient to ask his PCP for GI referral for outpatient EGD.      Procedures: Family communication : wife at bedside Consults : none CODE STATUS: full DVT Prophylaxis : Level of care: Telemetry Status is: Inpatient Remains inpatient appropriate because: ongoing treatment for community acquired pneumonia    TOTAL TIME TAKING CARE OF THIS PATIENT: 40 minutes.  >50% time spent on counselling and coordination of care  Note: This dictation was prepared with Dragon dictation along with smaller phrase technology. Any transcriptional errors that result from this process are unintentional.  Leita Blanch M.D    [facility]   CC: Primary care physician; Valere Ozell Shove, MD

## 2024-12-13 NOTE — Progress Notes (Signed)
 MEWS Progress Note  Patient Details Name: ANDREE GOLPHIN MRN: [id number] DOB: 1951/11/28 Today's Date: 12/13/2024   MEWS Flowsheet Documentation:  Assess: MEWS Score Temp: 98.9 F (37.2 C) BP: 98/86 MAP (mmHg): 93 Pulse Rate: (!) 42 ECG Heart Rate: 82 Resp: 20 Level of Consciousness: Alert SpO2: 95 % O2 Device: Room Air Assess: MEWS Score MEWS Temp: 0 MEWS Systolic: 1 MEWS Pulse: 1 MEWS RR: 0 MEWS LOC: 0 MEWS Score: 2 MEWS Score Color: Yellow Assess: SIRS CRITERIA SIRS Temperature : 0 SIRS Respirations : 0 SIRS Pulse: 0 SIRS WBC: 0 SIRS Score Sum : 0 SIRS Temperature : 0 SIRS Pulse: 0 SIRS Respirations : 0 SIRS WBC: 0 SIRS Score Sum : 0 Assess: if the MEWS score is Yellow or Red Were vital signs accurate and taken at a resting state?: Yes Does the patient meet 2 or more of the SIRS criteria?: No MEWS guidelines implemented : Yes, yellow Treat MEWS Interventions: Considered administering scheduled or prn medications/treatments as ordered Take Vital Signs Increase Vital Sign Frequency : Yellow: Q2hr x1, continue Q4hrs until patient remains green for 12hrs Escalate MEWS: Escalate: Yellow: Discuss with charge nurse and consider notifying provider and/or RRT Notify: Charge Nurse/RN Name of Charge Nurse/RN Notified: Tolbert, RN Provider Notification Provider Name/Title: Dr. Hilma Date Provider Notified: 12/13/24 Time Provider Notified: 0041 Method of Notification: Page Notification Reason: Other (Comment) Provider response: See new orders (yellow mews) Date of Provider Response: 12/13/24 Time of Provider Response: 0042      Arnaldo GORMAN Agee 12/13/2024, 1:22 AM

## 2024-12-13 NOTE — Plan of Care (Signed)
°  Problem: Education: Goal: Ability to describe self-care measures that may prevent or decrease complications (Diabetes Survival Skills Education) will improve Outcome: Progressing Goal: Individualized Educational Video(s) Outcome: Progressing   Problem: Coping: Goal: Ability to adjust to condition or change in health will improve Outcome: Progressing   Problem: Fluid Volume: Goal: Ability to maintain a balanced intake and output will improve Outcome: Progressing   Problem: Health Behavior/Discharge Planning: Goal: Ability to identify and utilize available resources and services will improve Outcome: Progressing Goal: Ability to manage health-related needs will improve Outcome: Progressing   Problem: Metabolic: Goal: Ability to maintain appropriate glucose levels will improve Outcome: Progressing   Problem: Education: Goal: Knowledge of General Education information will improve Description: Including pain rating scale, medication(s)/side effects and non-pharmacologic comfort measures Outcome: Progressing   Problem: Tissue Perfusion: Goal: Adequacy of tissue perfusion will improve Outcome: Progressing   Problem: Clinical Measurements: Goal: Ability to maintain clinical measurements within normal limits will improve Outcome: Progressing Goal: Will remain free from infection Outcome: Progressing Goal: Diagnostic test results will improve Outcome: Progressing Goal: Respiratory complications will improve Outcome: Progressing Goal: Cardiovascular complication will be avoided Outcome: Progressing   Problem: Elimination: Goal: Will not experience complications related to bowel motility Outcome: Progressing Goal: Will not experience complications related to urinary retention Outcome: Progressing   Problem: Coping: Goal: Level of anxiety will decrease Outcome: Progressing   Problem: Nutrition: Goal: Adequate nutrition will be maintained Outcome: Progressing    Problem: Pain Managment: Goal: General experience of comfort will improve and/or be controlled Outcome: Progressing   Problem: Respiratory: Goal: Ability to maintain adequate ventilation will improve Outcome: Progressing Goal: Ability to maintain a clear airway will improve Outcome: Progressing   Problem: Clinical Measurements: Goal: Ability to maintain a body temperature in the normal range will improve Outcome: Progressing   Problem: Activity: Goal: Ability to tolerate increased activity will improve Outcome: Progressing

## 2024-12-13 NOTE — Progress Notes (Signed)
 Mobility Specialist - Progress Note   12/13/24 1000  Mobility  Activity Ambulated with assistance;Stood at bedside;Dangled on edge of bed;Respositioned in chair  Level of Assistance Standby assist, set-up cues, supervision of patient - no hands on  Assistive Device None  Distance Ambulated (ft) 320 ft  Range of Motion/Exercises All extremities  Activity Response Tolerated well  Mobility visit 1 Mobility  Mobility Specialist Start Time (ACUTE ONLY) 1001  Mobility Specialist Stop Time (ACUTE ONLY) 1016  Mobility Specialist Time Calculation (min) (ACUTE ONLY) 15 min   Pt was supine in bed with the HOB elevated on RA upon entry. Pt agreed to mobility. Pt is able today to get to the EOB independently with bed features. Pt is able today to STS independently with no AD. Pt ambulated well with no AD. Pt didn't need a recovery break throughout activity. Pt was able after activity to be repositioned in the recliner with needs in reach and nurse in the room upon exit.  Clem Rodes Mobility Specialist 12/13/2024, 12:14 PM

## 2024-12-13 NOTE — Progress Notes (Signed)
 PHARMACY - PHYSICIAN COMMUNICATION CRITICAL VALUE ALERT - BLOOD CULTURE IDENTIFICATION (BCID)  Bob Miller is an 73 y.o. male who presented to [facility] on 12/12/2024 with a chief complaint of CAP   Assessment:  Staph species in 1 of 4 bottles, most likely a contaminant (include suspected source if known)  Name of physician (or Provider) Contacted: [place]   Current antibiotics: Doxycycline  100 mg IV Q12H ,  Ceftriaxone  2 gm IV Q24H   Changes to prescribed antibiotics recommended:  Patient is on recommended antibiotics - No changes needed  Results for orders placed or performed during the hospital encounter of 12/12/24  Blood Culture ID Panel (Reflexed) (Collected: 12/12/2024  9:13 PM)  Result Value Ref Range   Enterococcus faecalis NOT DETECTED NOT DETECTED   Enterococcus Faecium NOT DETECTED NOT DETECTED   Listeria monocytogenes NOT DETECTED NOT DETECTED   Staphylococcus species DETECTED (A) NOT DETECTED   Staphylococcus aureus (BCID) NOT DETECTED NOT DETECTED   Staphylococcus epidermidis NOT DETECTED NOT DETECTED   Staphylococcus lugdunensis NOT DETECTED NOT DETECTED   Streptococcus species NOT DETECTED NOT DETECTED   Streptococcus agalactiae NOT DETECTED NOT DETECTED   Streptococcus pneumoniae NOT DETECTED NOT DETECTED   Streptococcus pyogenes NOT DETECTED NOT DETECTED   A.calcoaceticus-baumannii NOT DETECTED NOT DETECTED   Bacteroides fragilis NOT DETECTED NOT DETECTED   Enterobacterales NOT DETECTED NOT DETECTED   Enterobacter cloacae complex NOT DETECTED NOT DETECTED   Escherichia coli NOT DETECTED NOT DETECTED   Klebsiella aerogenes NOT DETECTED NOT DETECTED   Klebsiella oxytoca NOT DETECTED NOT DETECTED   Klebsiella pneumoniae NOT DETECTED NOT DETECTED   Proteus species NOT DETECTED NOT DETECTED   Salmonella species NOT DETECTED NOT DETECTED   Serratia marcescens NOT DETECTED NOT DETECTED   Haemophilus influenzae NOT DETECTED NOT DETECTED   Neisseria meningitidis NOT  DETECTED NOT DETECTED   Pseudomonas aeruginosa NOT DETECTED NOT DETECTED   Stenotrophomonas maltophilia NOT DETECTED NOT DETECTED   Candida albicans NOT DETECTED NOT DETECTED   Candida auris NOT DETECTED NOT DETECTED   Candida glabrata NOT DETECTED NOT DETECTED   Candida krusei NOT DETECTED NOT DETECTED   Candida parapsilosis NOT DETECTED NOT DETECTED   Candida tropicalis NOT DETECTED NOT DETECTED   Cryptococcus neoformans/gattii NOT DETECTED NOT DETECTED    [name] D 12/13/2024  10:33 PM

## 2024-12-13 NOTE — Inpatient Diabetes Management (Signed)
 Inpatient Diabetes Program Recommendations  AACE/ADA: New Consensus Statement on Inpatient Glycemic Control   Target Ranges:  Prepandial:   less than 140 mg/dL      Peak postprandial:   less than 180 mg/dL (1-2 hours)      Critically ill patients:  140 - 180 mg/dL   Lab Results  Component Value Date   GLUCAP 148 (H) 12/13/2024   HGBA1C 11.0 (H) 10/31/2022    Latest Reference Range & Units 12/12/24 22:04 12/12/24 22:34 12/13/24 07:21  Glucose-Capillary 70 - 99 mg/dL 817 (H) 817 (H) 851 (H)   Review of Glycemic Control  Diabetes history: DM2  Outpatient Diabetes medications:  Metformin  1,000mg  BID Jardaince 10mg  daily  Semlgee 50 units at bedtime  Novolog  6 units TID with meals   Current orders for Inpatient glycemic control:  Lantus  35 units daily  Novolog  0-9 units TID + 0-5 at bedtime   Inpatient Diabetes Program Recommendations:   Please consider ordering Hemoglobin A1C.   Spoke with patient over the phone to discuss diabetes management. Patient reports being followed by PCP, Dr Valere for diabetes management and currently takes Metformin  1,000mg  BID, Jardiance 10mg  daily, Semlgee 50 units at bedtime, Novolog  6 units TID with meals outpatient for diabetes control. Inquired about prior A1C and patient reports his A1C was 11% at one time but after starting Jardiance, it dropped to around 7%. Discussed glucose and A1C goals. Discussed importance of checking CBGs and maintaining good CBG control to prevent long-term and short-term complications. Explained how hyperglycemia leads to damage within blood vessels which lead to the common complications seen with uncontrolled diabetes. Encouraged patient to check glucose 4 times per day (before meals and at bedtime). Patient verbalized understanding of information discussed and reports no further questions at this time related to diabetes.  Thanks,  Bob Search, RN, MSN, [facility]  Inpatient Diabetes Coordinator  Pager [phone]  (8a-5p)

## 2024-12-14 ENCOUNTER — Other Ambulatory Visit: Payer: Self-pay

## 2024-12-14 DIAGNOSIS — E1159 Type 2 diabetes mellitus with other circulatory complications: Secondary | ICD-10-CM | POA: Diagnosis not present

## 2024-12-14 DIAGNOSIS — I5032 Chronic diastolic (congestive) heart failure: Secondary | ICD-10-CM | POA: Diagnosis not present

## 2024-12-14 DIAGNOSIS — N179 Acute kidney failure, unspecified: Secondary | ICD-10-CM | POA: Diagnosis not present

## 2024-12-14 DIAGNOSIS — I152 Hypertension secondary to endocrine disorders: Secondary | ICD-10-CM | POA: Diagnosis not present

## 2024-12-14 DIAGNOSIS — J189 Pneumonia, unspecified organism: Secondary | ICD-10-CM | POA: Diagnosis not present

## 2024-12-14 DIAGNOSIS — N1832 Chronic kidney disease, stage 3b: Secondary | ICD-10-CM | POA: Diagnosis not present

## 2024-12-14 LAB — GLUCOSE, CAPILLARY
Glucose-Capillary: 167 mg/dL — ABNORMAL HIGH (ref 70–99)
Glucose-Capillary: 209 mg/dL — ABNORMAL HIGH (ref 70–99)

## 2024-12-14 LAB — HEMOGLOBIN A1C
Hgb A1c MFr Bld: 7.1 % — ABNORMAL HIGH (ref 4.8–5.6)
Mean Plasma Glucose: 157.07 mg/dL

## 2024-12-14 LAB — STREP PNEUMONIAE URINARY ANTIGEN: Strep Pneumo Urinary Antigen: NEGATIVE

## 2024-12-14 MED ORDER — DOXYCYCLINE HYCLATE 100 MG PO TABS
100.0000 mg | ORAL_TABLET | Freq: Two times a day (BID) | ORAL | 0 refills | Status: AC
  Filled 2024-12-14: qty 10, 5d supply, fill #0

## 2024-12-14 MED ORDER — PHENOL 1.4 % MT LIQD
1.0000 | OROMUCOSAL | Status: DC | PRN
  Administered 2024-12-14: 06:00:00 1 via OROMUCOSAL
  Filled 2024-12-14: qty 177

## 2024-12-14 MED ORDER — DM-GUAIFENESIN ER 30-600 MG PO TB12
1.0000 | ORAL_TABLET | Freq: Two times a day (BID) | ORAL | 0 refills | Status: AC | PRN
  Filled 2024-12-14: qty 20, 10d supply, fill #0

## 2024-12-14 MED ORDER — CEFUROXIME AXETIL 500 MG PO TABS
500.0000 mg | ORAL_TABLET | Freq: Two times a day (BID) | ORAL | Status: DC
  Filled 2024-12-14: qty 1

## 2024-12-14 MED ORDER — CEFUROXIME AXETIL 500 MG PO TABS
500.0000 mg | ORAL_TABLET | Freq: Two times a day (BID) | ORAL | 0 refills | Status: AC
  Filled 2024-12-14: qty 10, 5d supply, fill #0

## 2024-12-14 MED ORDER — DOXYCYCLINE HYCLATE 100 MG PO TABS: 100.0000 mg | ORAL_TABLET | Freq: Two times a day (BID) | ORAL | Status: DC

## 2024-12-14 NOTE — Plan of Care (Signed)

## 2024-12-14 NOTE — Discharge Instructions (Signed)
 Patient to keep log of sugars at home and discussed with PCP. Hold metformin  to follow-up labs are checked by PCP. Keep log of blood pressure at home. If systolic blood pressure less than 130 hold BP meds. If you any questions reach out to PCP.

## 2024-12-14 NOTE — Progress Notes (Signed)
 Mobility Specialist - Progress Note   12/14/24 1100  Mobility  Activity Ambulated with assistance;Stood at bedside;Dangled on edge of bed  Level of Assistance Independent after set-up  Assistive Device  (IV Stand)  Distance Ambulated (ft) 180 ft  Range of Motion/Exercises All extremities;Active  Activity Response Tolerated well  Mobility visit 1 Mobility  Mobility Specialist Start Time (ACUTE ONLY) 0915  Mobility Specialist Stop Time (ACUTE ONLY) [id number]  Mobility Specialist Time Calculation (min) (ACUTE ONLY) 22 min   Pt was supine in bed with the HOB elevated on RA. Pt agreed to mobility. Pt was able today to get to the EOB independently with bed features. Pt is able to STS independently with no AD. Pt was receiving IV fluids during activity. Pt ambulated well. Pt was a slow initially, but picked up pace halfway. Pt used recovery break to check vitals. After activity pt returned to the room back in bed with needs in reach.  Clem Rodes Mobility Specialist 12/14/2024, 11:29 AM

## 2024-12-14 NOTE — Discharge Summary (Signed)
 Physician Discharge Summary   Patient: Bob Miller MRN: [id number] DOB: 06-04-1951  Admit date:     12/12/2024  Discharge date: 12/14/2024  Discharge Physician: Leita Blanch   PCP: Valere Ozell Shove, MD   Recommendations at discharge:    Patient to keep log of sugars at home and discussed with PCP. Hold metformin  to follow-up labs are checked by PCP. Keep log of blood pressure at home. If systolic blood pressure less than 130 hold BP meds. If you any questions reach out to PCP follow-up PCP in 1 to 2 follow-up Dr. Normajean bathe pulmonary on your scheduled appointment December 29  Discharge Diagnoses: Principal Problem:   CAP (community acquired pneumonia) Active Problems:   Acute renal failure superimposed on stage 3b chronic kidney disease (HCC)   Chronic diastolic CHF (congestive heart failure) (HCC)   CAD (coronary artery disease)   Hypertension associated with diabetes (HCC)   Bilateral carotid artery stenosis   HLD (hyperlipidemia)   Type II diabetes mellitus with renal manifestations (HCC)   BPH (benign prostatic hyperplasia)   Bob Miller is a 73 y.o. male with medical history significant of HTN, HLD, DM, CAD, dCHF, BPH, CKD-3b, bilateral carotid artery stenosis (s/p of stent placement), RLS, who presents with mild dry cough, fever, chills, weakness.  Patient states that he has been sick in the past 4 days, withsymptoms including mild dry cough, fever, chills, malaise, generalized weakness.    CXR: Hazy masslike consolidation along the lateral right mid lung, worrisome for pneumonia in the correct clinical context. Nonemergent chest CT with IV contrast may be of benefit to exclude underlying lung mass.   CT of abdomen/pelvis: 1. No acute findings in the abdomen or pelvis. 2. Small hiatal hernia with distal esophageal wall thickening; consider endoscopic evaluation. 3. Punctate nonobstructing left renal calculus. 4. Trace bilateral pleural effusions.   CT of  chest: 1. Acute pneumonia in the posterior right upper lobe, corresponding to prior plain film examination.  CAP (community acquired pneumonia): CT of chest showed acute pneumonia in the posterior right upper lobe. Pt has WBC 13.3 and fever at home with temperature 99.7 in ED. lactic acid normal 1.5.   -  Rocephin  and doxycycline  (patient received 1 dose of azithromycin  in ED. due to QT prolongation 522, switched to doxycycline ).-- Change to PO antibiotics to complete a seven day course - Mucinex  for cough  - Bronchodilators as needed -  blood culture 1/2  so far neg. status patient is reported. -received IV fluids -- no high grade fever today. Patient overall tolerating PO diet well -- patient will follow-up with pulmonary Dr. Nelida as outpatient. Wife has already made appointment.   Acute renal failure superimposed on stage 3b chronic kidney disease (HCC): Baseline creatinine 1.42.  His creatinine is at 2.01, BUN 31, GFR 35 today.  Likely due to dehydration and continuation of Cozaar . - IV fluids as above - Cozaar  is on hold -- creatinine trending down to 1.7   Chronic diastolic CHF (congestive heart failure) (HCC): 2D echo on 03/08/2024 showed EF 55% with grade 1 diastolic dysfunction.  Patient has elevated BNP 2301, but no leg edema or JVD.  Does not seem to have CHF exacerbation.  Obesity patient is at the risk of developing CHF exacerbation. -Patient appears stable on room air. Ambulated with mobility therapist sats remains stable   CAD (coronary artery disease): -Continue aspirin , Plavix  and Crestor    Hypertension associated with diabetes (HCC) -IV hydralazine  as needed -will resume bp  meds since blood pressure trending up. Holding parameter for home is given.   Bilateral carotid artery stenosis -Aspirin , Plavix , Crestor    HLD (hyperlipidemia) -Crestor    Type II diabetes mellitus with renal manifestations [facility]): Recent A1c 11.0, poorly controlled.  Patient taking metformin ,  Farxiga  and glargine insulin  50 units daily -SSI -- patient has been noncompliant with diet -- patient's A1c is 7.1. Continue current insulin  doses at home. -- Holding metformin  at discharge due to creatinine being 1.7. Patient to follow-up with PCP and defer decision per PCP   BPH (benign prostatic hyperplasia) -continue home meds   Abnormal findings of CT abdomen/pelvis with esophageal wall thickening: this is an incidental findings by CT scan.  I have advised the patient to ask his PCP for GI referral for outpatient EGD.   Overall hemodynamically stable. Patient ambulated around the nurses station with mobility therapist without difficulty. Discussed discharge plan at length. Patient is eager to go home. Wife at bedside.     Procedures: Family communication : wife at bedside Consults : none CODE STATUS: full DVT Prophylaxis : Lovenox       DISCHARGE MEDICATION: Allergies as of 12/14/2024       Reactions   Codeine    anxiety   Crestor  [rosuvastatin ]    Muscle pain   Lipitor [atorvastatin]    Muscle pain   Tramadol         Medication List     PAUSE taking these medications    metFORMIN  1000 MG tablet Wait to take this until your doctor or other care provider tells you to start again. By PCP. Get creat checked as out pt and d/w PCP before resuming it Commonly known as: GLUCOPHAGE  Take 1,000 mg by mouth 2 (two) times daily with a meal.       STOP taking these medications    Basaglar  KwikPen 100 UNIT/ML   ondansetron  8 MG disintegrating tablet Commonly known as: ZOFRAN -ODT       TAKE these medications    acetaminophen  325 MG tablet Commonly known as: TYLENOL  Take 1-2 tablets (325-650 mg total) by mouth every 4 (four) hours as needed for mild pain (or temp >/= 101 F).   amLODipine  10 MG tablet Commonly known as: NORVASC  Take 10 mg by mouth daily.   aspirin  EC 81 MG tablet Take 1 tablet (81 mg total) by mouth daily. Swallow whole.   cefUROXime  500  MG tablet Commonly known as: CEFTIN  Take 1 tablet (500 mg total) by mouth 2 (two) times daily with a meal for 5 days.   clopidogrel  75 MG tablet Commonly known as: PLAVIX  Take 1 tablet (75 mg total) by mouth daily.   cyanocobalamin  1000 MCG tablet Take 1 tablet by mouth daily.   dextromethorphan -guaiFENesin  30-600 MG 12hr tablet Commonly known as: MUCINEX  DM Take 1 tablet by mouth 2 (two) times daily as needed for cough.   doxazosin  2 MG tablet Commonly known as: CARDURA  Take 2 mg by mouth daily.   doxycycline  100 MG tablet Commonly known as: VIBRA -TABS Take 1 tablet (100 mg total) by mouth every 12 (twelve) hours.   Farxiga  10 MG Tabs tablet Generic drug: dapagliflozin  propanediol Take 10 mg by mouth daily.   ferrous sulfate  325 (65 FE) MG EC tablet Take 325 mg by mouth daily with breakfast.   Fiasp  FlexTouch 100 UNIT/ML FlexTouch Pen Generic drug: insulin  aspart Inject into the skin.   gabapentin  300 MG capsule Commonly known as: NEURONTIN  Take 300 mg by mouth 4 (four) times  daily.   glucosamine-chondroitin 500-400 MG tablet Take 1 tablet by mouth daily.   Insulin  Aspart FlexPen 100 UNIT/ML Commonly known as: NOVOLOG  Inject 3 Units into the skin in the morning, at noon, and at bedtime.   insulin  glargine-yfgn 100 UNIT/ML Pen Commonly known as: SEMGLEE  Inject 50 Units into the skin daily.   isosorbide  mononitrate 60 MG 24 hr tablet Commonly known as: IMDUR  Take 60 mg by mouth daily.   losartan  100 MG tablet Commonly known as: COZAAR  Take 100 mg by mouth daily.   metoprolol  tartrate 25 MG tablet Commonly known as: LOPRESSOR  Take 25 mg by mouth 2 (two) times daily.   nitroGLYCERIN  0.4 MG SL tablet Commonly known as: NITROSTAT  Place 0.4 mg under the tongue every 5 (five) minutes as needed for chest pain.   pantoprazole  40 MG tablet Commonly known as: PROTONIX  Take 40 mg by mouth daily.   rosuvastatin  40 MG tablet Commonly known as: CRESTOR  Take 40  mg by mouth at bedtime. What changed: Another medication with the same name was removed. Continue taking this medication, and follow the directions you see here.   tamsulosin  0.4 MG Caps capsule Commonly known as: FLOMAX  TAKE 1 CAPSULE BY MOUTH EVERY DAY   Vitamin D  (Ergocalciferol ) 1.25 MG (50000 UNIT) Caps capsule Commonly known as: DRISDOL  Take 50,000 Units by mouth once a week.        Follow-up Information     Valere Ozell Shove, MD. Schedule an appointment as soon as possible for a visit in 1 week(s).   Specialty: Internal Medicine Contact information: [address] [place] [address] [phone]         Fernand Elfreda LABOR, MD. Go on 12/25/2024.   Specialties: Internal Medicine, Pulmonary Disease Why: On your scheduled appointment Contact information: [address] [place] [address] [phone]         Alluri, Keller BROCKS, MD. Go to.   Specialty: Cardiology Why: On your scheduled appointment Contact information: [address] [place] [address] [phone]                Discharge Exam: Fredricka Weights   12/12/24 2158 12/14/24 0434  Weight: 108.8 kg 109.2 kg   GENERAL:  73 y.o.-year-old patient with no acute distress.  LUNGS: Normal breath sounds bilaterally, no wheezing CARDIOVASCULAR: S1, S2 normal. No murmur   ABDOMEN: Soft, nontender, nondistended. Bowel sounds present.  EXTREMITIES: No  edema b/l.    NEUROLOGIC: nonfocal  patient is alert and awake  Condition at discharge: fair  The results of significant diagnostics from this hospitalization (including imaging, microbiology, ancillary and laboratory) are listed below for reference.   Imaging Studies: CT Chest Wo Contrast Result Date: 12/12/2024 EXAM: CT CHEST WITHOUT CONTRAST 12/12/2024 09:20:52 PM TECHNIQUE: CT of the chest was performed without the administration of intravenous contrast. Multiplanar reformatted images are provided for review. Automated  exposure control, iterative reconstruction, and/or weight based adjustment of the mA/kV was utilized to reduce the radiation dose to as low as reasonably achievable. COMPARISON: Chest x-[place] from earlier in the same day. CLINICAL HISTORY: Abnormal chest x-Bostic; evaluate for pneumonia. FINDINGS: MEDIASTINUM: Heart: No cardiac enlargement is seen. Heavy coronary calcifications and changes of prior coronary bypass graft are seen. Pericardium is unremarkable. The central airways are clear. The thoracic aorta shows diffuse atherosclerotic calcifications. No aneurysmal dilatation of the aorta is noted. The esophagus demonstrates mild wall thickening, likely related to reflux. LYMPH NODES: No mediastinal, hilar or axillary lymphadenopathy. LUNGS AND PLEURA:  Lungs are well aerated bilaterally. Focal infiltrate is noted in the posterior right upper lobe consistent with acute pneumonia. That corresponds to that seen on prior plain film examination. No pulmonary edema. No pleural effusion or pneumothorax. SOFT TISSUES/BONES: No acute abnormality of the bones or soft tissues. UPPER ABDOMEN: Limited images of the upper abdomen demonstrates no acute abnormality. IMPRESSION: 1. Acute pneumonia in the posterior right upper lobe, corresponding to prior plain film examination. Electronically signed by: Oneil Devonshire MD 12/12/2024 09:26 PM EST RP Workstation: [id number]   DG Chest Portable 1 View Result Date: 12/12/2024 CLINICAL DATA:  fever, fatigue, nausea EXAM: PORTABLE CHEST - 1 VIEW COMPARISON:  October 30, 2022 FINDINGS: Hazy masslike consolidation along the right lateral mid lung. No pleural effusion or pneumothorax. No cardiomegaly. Sternotomy wires and CABG markers. Cardiac stent noted. Aortic atherosclerosis. No acute fracture or destructive lesions. Multilevel thoracic osteophytosis. IMPRESSION: Hazy masslike consolidation along the lateral right mid lung, worrisome for pneumonia in the correct clinical context. Nonemergent  chest CT with IV contrast may be of benefit to exclude underlying lung mass. Electronically Signed   By: Rogelia Myers M.D.   On: 12/12/2024 19:17   CT ABDOMEN PELVIS WO CONTRAST Result Date: 12/12/2024 EXAM: CT ABDOMEN AND PELVIS WITHOUT CONTRAST 12/12/2024 07:02:49 PM TECHNIQUE: CT of the abdomen and pelvis was performed without the administration of intravenous contrast. Multiplanar reformatted images are provided for review. Automated exposure control, iterative reconstruction, and/or weight-based adjustment of the mA/kV was utilized to reduce the radiation dose to as low as reasonably achievable. COMPARISON: CT chest abdomen and pelvis 05/17/2022. CLINICAL HISTORY: Abdominal pain, acute, nonlocalized. FINDINGS: LOWER CHEST: There are trace bilateral pleural effusions. LIVER: The liver is unremarkable. GALLBLADDER AND BILE DUCTS: Gallbladder is unremarkable. No biliary ductal dilatation. SPLEEN: Periventricular calcified cyst is seen in the spleen measuring 1 cm, unchanged. Splenules are present. PANCREAS: No acute abnormality. ADRENAL GLANDS: No acute abnormality. KIDNEYS, URETERS AND BLADDER: There is a single punctate calculus in the left kidney. There is mild nonspecific bilateral perinephric fat stranding which is similar to prior. There are likely small left peripelvic cysts. No hydronephrosis. No perinephric or periureteral stranding. Urinary bladder is unremarkable. GI AND BOWEL: Stomach demonstrates no acute abnormality. There is a small hiatal hernia. There is wall thickening of the distal esophagus. The appendix appears normal. There is no bowel obstruction. PERITONEUM AND RETROPERITONEUM: No ascites. No free air. VASCULATURE: Aorta is normal in caliber. There are atherosclerotic calcifications of the aorta and iliac arteries. LYMPH NODES: No lymphadenopathy. REPRODUCTIVE ORGANS: Prostate gland is mildly enlarged. BONES AND SOFT TISSUES: No acute osseous abnormality. There are small fat  containing bilateral inguinal hernias. IMPRESSION: 1. No acute findings in the abdomen or pelvis. 2. Small hiatal hernia with distal esophageal wall thickening; consider endoscopic evaluation. 3. Punctate nonobstructing left renal calculus. 4. Trace bilateral pleural effusions. Electronically signed by: Greig Pique MD 12/12/2024 07:14 PM EST RP Workstation: [id number]    Microbiology: Results for orders placed or performed during the hospital encounter of 12/12/24  Resp panel by RT-PCR (RSV, Flu A&B, Covid) Anterior Nasal Swab     Status: None   Collection Time: 12/12/24  7:22 PM   Specimen: Anterior Nasal Swab  Result Value Ref Range Status   SARS Coronavirus 2 by RT PCR NEGATIVE NEGATIVE Final    Comment: (NOTE) SARS-CoV-2 target nucleic acids are NOT DETECTED.  The SARS-CoV-2 RNA is generally detectable in upper respiratory specimens during the acute phase of infection. The lowest concentration  of SARS-CoV-2 viral copies this assay can detect is 138 copies/mL. A negative result does not preclude SARS-Cov-2 infection and should not be used as the sole basis for treatment or other patient management decisions. A negative result may occur with  improper specimen collection/handling, submission of specimen other than nasopharyngeal swab, presence of viral mutation(s) within the areas targeted by this assay, and inadequate number of viral copies(<138 copies/mL). A negative result must be combined with clinical observations, patient history, and epidemiological information. The expected result is Negative.  Fact Sheet for Patients:  bloggercourse.com  Fact Sheet for Healthcare Providers:  seriousbroker.it  This test is no t yet approved or cleared by the [place]  FDA and  has been authorized for detection and/or diagnosis of SARS-CoV-2 by FDA under an Emergency Use Authorization (EUA). This EUA will remain  in effect (meaning this  test can be used) for the duration of the COVID-19 declaration under Section 564(b)(1) of the Act, 21 U.S.C.section 360bbb-3(b)(1), unless the authorization is terminated  or revoked sooner.       Influenza A by PCR NEGATIVE NEGATIVE Final   Influenza B by PCR NEGATIVE NEGATIVE Final    Comment: (NOTE) The Xpert Xpress SARS-CoV-2/FLU/RSV plus assay is intended as an aid in the diagnosis of influenza from Nasopharyngeal swab specimens and should not be used as a sole basis for treatment. Nasal washings and aspirates are unacceptable for Xpert Xpress SARS-CoV-2/FLU/RSV testing.  Fact Sheet for Patients: bloggercourse.com  Fact Sheet for Healthcare Providers: seriousbroker.it  This test is not yet approved or cleared by the [place]  FDA and has been authorized for detection and/or diagnosis of SARS-CoV-2 by FDA under an Emergency Use Authorization (EUA). This EUA will remain in effect (meaning this test can be used) for the duration of the COVID-19 declaration under Section 564(b)(1) of the Act, 21 U.S.C. section 360bbb-3(b)(1), unless the authorization is terminated or revoked.     Resp Syncytial Virus by PCR NEGATIVE NEGATIVE Final    Comment: (NOTE) Fact Sheet for Patients: bloggercourse.com  Fact Sheet for Healthcare Providers: seriousbroker.it  This test is not yet approved or cleared by the [place]  FDA and has been authorized for detection and/or diagnosis of SARS-CoV-2 by FDA under an Emergency Use Authorization (EUA). This EUA will remain in effect (meaning this test can be used) for the duration of the COVID-19 declaration under Section 564(b)(1) of the Act, 21 U.S.C. section 360bbb-3(b)(1), unless the authorization is terminated or revoked.  Performed at [facility], [address] [place], [place] [address]   Blood Culture (routine x 2)      Status: None (Preliminary result)   Collection Time: 12/12/24  9:13 PM   Specimen: Left Antecubital; Blood  Result Value Ref Range Status   Specimen Description LEFT ANTECUBITAL  Final   Special Requests   Final    BOTTLES DRAWN AEROBIC AND ANAEROBIC Blood Culture adequate volume   Culture  Setup Time   Final    GRAM POSITIVE COCCI ANAEROBIC BOTTLE ONLY Organism ID to follow CRITICAL RESULT CALLED TO, READ BACK BY AND VERIFIED WITH: JASON ROBYNS AT 2159 ON 12/13/24 BY SS Performed at [facility], [address] [place], [place] [address]    Culture [facility] POSITIVE COCCI  Final   Report Status PENDING  Incomplete  Blood Culture (routine x 2)     Status: None (Preliminary result)   Collection Time: 12/12/24  9:13 PM   Specimen: Right Antecubital; Blood  Result Value Ref  Range Status   Specimen Description RIGHT ANTECUBITAL  Final   Special Requests   Final    BOTTLES DRAWN AEROBIC AND ANAEROBIC Blood Culture results may not be optimal due to an inadequate volume of blood received in culture bottles   Culture   Final    NO GROWTH 2 DAYS Performed at [facility], [address] [place], [place] [address]    Report Status PENDING  Incomplete  Blood Culture ID Panel (Reflexed)     Status: Abnormal   Collection Time: 12/12/24  9:13 PM  Result Value Ref Range Status   Enterococcus faecalis NOT DETECTED NOT DETECTED Final   Enterococcus Faecium NOT DETECTED NOT DETECTED Final   Listeria monocytogenes NOT DETECTED NOT DETECTED Final   Staphylococcus species DETECTED (A) NOT DETECTED Final    Comment: CRITICAL RESULT CALLED TO, READ BACK BY AND VERIFIED WITH: JASON ROBYNS AT 2159 ON 12/13/24 BY SS    Staphylococcus aureus (BCID) NOT DETECTED NOT DETECTED Final   Staphylococcus epidermidis NOT DETECTED NOT DETECTED Final   Staphylococcus lugdunensis NOT DETECTED NOT DETECTED Final   Streptococcus species NOT DETECTED NOT DETECTED Final   Streptococcus agalactiae  NOT DETECTED NOT DETECTED Final   Streptococcus pneumoniae NOT DETECTED NOT DETECTED Final   Streptococcus pyogenes NOT DETECTED NOT DETECTED Final   A.calcoaceticus-baumannii NOT DETECTED NOT DETECTED Final   Bacteroides fragilis NOT DETECTED NOT DETECTED Final   Enterobacterales NOT DETECTED NOT DETECTED Final   Enterobacter cloacae complex NOT DETECTED NOT DETECTED Final   Escherichia coli NOT DETECTED NOT DETECTED Final   Klebsiella aerogenes NOT DETECTED NOT DETECTED Final   Klebsiella oxytoca NOT DETECTED NOT DETECTED Final   Klebsiella pneumoniae NOT DETECTED NOT DETECTED Final   Proteus species NOT DETECTED NOT DETECTED Final   Salmonella species NOT DETECTED NOT DETECTED Final   Serratia marcescens NOT DETECTED NOT DETECTED Final   Haemophilus influenzae NOT DETECTED NOT DETECTED Final   Neisseria meningitidis NOT DETECTED NOT DETECTED Final   Pseudomonas aeruginosa NOT DETECTED NOT DETECTED Final   Stenotrophomonas maltophilia NOT DETECTED NOT DETECTED Final   Candida albicans NOT DETECTED NOT DETECTED Final   Candida auris NOT DETECTED NOT DETECTED Final   Candida glabrata NOT DETECTED NOT DETECTED Final   Candida krusei NOT DETECTED NOT DETECTED Final   Candida parapsilosis NOT DETECTED NOT DETECTED Final   Candida tropicalis NOT DETECTED NOT DETECTED Final   Cryptococcus neoformans/gattii NOT DETECTED NOT DETECTED Final    Comment: Performed at [facility], [address] [place], [place] [address]    Labs: CBC: Recent Labs  Lab 12/12/24 1706  WBC 13.3*  HGB 13.4  HCT 40.6  MCV 83.7  PLT 173   Basic Metabolic Panel: Recent Labs  Lab 12/12/24 1706 12/12/24 2340 12/13/24 0632  NA 132*  --  134*  K 4.1  --  4.0  CL 95*  --  101  CO2 23  --  22  GLUCOSE 213*  --  142*  BUN 31*  --  30*  CREATININE 2.01*  --  1.72*  CALCIUM  9.1  --  8.4*  MG  --  2.1  --    Liver Function Tests: Recent Labs  Lab 12/12/24 1706 12/13/24 0632  AST 62* 47*   ALT 42 38  ALKPHOS 50 45  BILITOT 1.1 0.7  PROT 6.8 6.0*  ALBUMIN 3.9 3.3*   CBG: Recent Labs  Lab 12/13/24 1141 12/13/24 1648 12/13/24 2039 12/14/24 0829 12/14/24 1140  GLUCAP 224* 269* 174* 167* 209*    Discharge time spent: greater than 30 minutes.  Signed: Leita Blanch, MD [facility] 12/14/2024

## 2024-12-16 LAB — CULTURE, BLOOD (ROUTINE X 2): Special Requests: ADEQUATE

## 2024-12-17 LAB — CULTURE, BLOOD (ROUTINE X 2): Culture: NO GROWTH

## 2024-12-17 LAB — LEGIONELLA PNEUMOPHILA SEROGP 1 UR AG: L. pneumophila Serogp 1 Ur Ag: NEGATIVE

## 2024-12-25 ENCOUNTER — Encounter: Payer: Self-pay | Admitting: Internal Medicine

## 2024-12-25 ENCOUNTER — Ambulatory Visit (INDEPENDENT_AMBULATORY_CARE_PROVIDER_SITE_OTHER): Payer: Self-pay | Admitting: Internal Medicine

## 2024-12-25 VITALS — BP 120/60 | HR 75 | Temp 98.0°F | Resp 16 | Ht 70.0 in | Wt 224.0 lb

## 2024-12-25 DIAGNOSIS — I5032 Chronic diastolic (congestive) heart failure: Secondary | ICD-10-CM

## 2024-12-25 DIAGNOSIS — J13 Pneumonia due to Streptococcus pneumoniae: Secondary | ICD-10-CM

## 2024-12-25 DIAGNOSIS — I4811 Longstanding persistent atrial fibrillation: Secondary | ICD-10-CM

## 2024-12-25 NOTE — Progress Notes (Signed)
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  Glucose, capillary     Status: Abnormal   Collection Time: 12/13/24 11:41 AM  Result Value Ref Range   Glucose-Capillary 224 (H) 70 - 99 mg/dL    Comment: Glucose reference  range applies only to samples taken after fasting for at least 8 hours.  Glucose, capillary     Status: Abnormal   Collection Time: 12/13/24  4:48 PM  Result Value Ref Range   Glucose-Capillary 269 (H) 70 - 99 mg/dL    Comment: Glucose reference range applies only to samples taken after fasting for at least 8 hours.  Glucose, capillary     Status: Abnormal   Collection Time: 12/13/24  8:39 PM  Result Value Ref Range   Glucose-Capillary 174 (H) 70 - 99 mg/dL    Comment: Glucose reference range applies only to samples taken after fasting for at least 8 hours.  Glucose, capillary     Status: Abnormal   Collection Time: 12/14/24  8:29 AM  Result Value Ref Range   Glucose-Capillary 167 (H) 70 - 99 mg/dL    Comment: Glucose reference range applies only to  samples taken after fasting for at least 8 hours.  Glucose, capillary     Status: Abnormal   Collection Time: 12/14/24 11:40 AM  Result Value Ref Range   Glucose-Capillary 209 (H) 70 - 99 mg/dL    Comment: Glucose reference range applies only to samples taken after fasting for at least 8 hours.    Radiology: CT Chest Wo Contrast Result Date: 12/12/2024 EXAM: CT CHEST WITHOUT CONTRAST 12/12/2024 09:20:52 PM TECHNIQUE: CT of the chest was performed without the administration of intravenous contrast. Multiplanar reformatted images are provided for review. Automated exposure control, iterative reconstruction, and/or weight based adjustment of the mA/kV was utilized to reduce the radiation dose to as low as reasonably achievable. COMPARISON: Chest x-Jaye from earlier in the same day. CLINICAL HISTORY: Abnormal chest x-Haertel; evaluate for pneumonia. FINDINGS: MEDIASTINUM: Heart: No cardiac enlargement is seen. Heavy coronary calcifications and changes of prior coronary bypass graft are seen. Pericardium is unremarkable. The central airways are clear. The thoracic aorta shows diffuse atherosclerotic calcifications. No aneurysmal dilatation of the aorta is  noted. The esophagus demonstrates mild wall thickening, likely related to reflux. LYMPH NODES: No mediastinal, hilar or axillary lymphadenopathy. LUNGS AND PLEURA: Lungs are well aerated bilaterally. Focal infiltrate is noted in the posterior right upper lobe consistent with acute pneumonia. That corresponds to that seen on prior plain film examination. No pulmonary edema. No pleural effusion or pneumothorax. SOFT TISSUES/BONES: No acute abnormality of the bones or soft tissues. UPPER ABDOMEN: Limited images of the upper abdomen demonstrates no acute abnormality. IMPRESSION: 1. Acute pneumonia in the posterior right upper lobe, corresponding to prior plain film examination. Electronically signed by: Oneil Devonshire MD 12/12/2024 09:26 PM EST RP Workstation: [id number]   DG Chest Portable 1 View Result Date: 12/12/2024 CLINICAL DATA:  fever, fatigue, nausea EXAM: PORTABLE CHEST - 1 VIEW COMPARISON:  October 30, 2022 FINDINGS: Hazy masslike consolidation along the right lateral mid lung. No pleural effusion or pneumothorax. No cardiomegaly. Sternotomy wires and CABG markers. Cardiac stent noted. Aortic atherosclerosis. No acute fracture or destructive lesions. Multilevel thoracic osteophytosis. IMPRESSION: Hazy masslike consolidation along the lateral right mid lung, worrisome for pneumonia in the correct clinical context. Nonemergent chest CT with IV contrast may be of benefit to exclude underlying lung mass. Electronically Signed   By: Rogelia Myers M.D.   On: 12/12/2024 19:17   CT ABDOMEN PELVIS WO CONTRAST Result Date: 12/12/2024 EXAM: CT ABDOMEN AND PELVIS WITHOUT CONTRAST 12/12/2024 07:02:49 PM TECHNIQUE: CT of the abdomen and pelvis was performed without the administration of intravenous contrast. Multiplanar reformatted images are provided for review. Automated exposure control, iterative reconstruction, and/or weight-based adjustment of the mA/kV was utilized to reduce the radiation dose to as low as  reasonably achievable. COMPARISON: CT chest abdomen and pelvis 05/17/2022. CLINICAL HISTORY: Abdominal pain, acute, nonlocalized. FINDINGS: LOWER CHEST: There are trace bilateral pleural effusions. LIVER: The liver is unremarkable. GALLBLADDER AND BILE DUCTS: Gallbladder is unremarkable. No biliary ductal dilatation. SPLEEN: Periventricular calcified cyst is seen in the spleen measuring 1 cm, unchanged. Splenules are present. PANCREAS: No acute abnormality. ADRENAL GLANDS: No acute abnormality. KIDNEYS, URETERS AND BLADDER: There is a single punctate calculus in the left kidney. There is mild nonspecific bilateral perinephric fat stranding which is similar to prior. There are likely small left peripelvic cysts. No hydronephrosis. No perinephric or periureteral stranding. Urinary bladder is unremarkable. GI AND BOWEL: Stomach demonstrates no acute abnormality. There is a small hiatal hernia. There is wall thickening of the distal esophagus.  The appendix appears normal. There is no bowel obstruction. PERITONEUM AND RETROPERITONEUM: No ascites. No free air. VASCULATURE: Aorta is normal in caliber. There are atherosclerotic calcifications of the aorta and iliac arteries. LYMPH NODES: No lymphadenopathy. REPRODUCTIVE ORGANS: Prostate gland is mildly enlarged. BONES AND SOFT TISSUES: No acute osseous abnormality. There are small fat containing bilateral inguinal hernias. IMPRESSION: 1. No acute findings in the abdomen or pelvis. 2. Small hiatal hernia with distal esophageal wall thickening; consider endoscopic evaluation. 3. Punctate nonobstructing left renal calculus. 4. Trace bilateral pleural effusions. Electronically signed by: Greig Pique MD 12/12/2024 07:14 PM EST RP Workstation: [id number]    No results found.  CT Chest Wo Contrast Result Date: 12/12/2024 EXAM: CT CHEST WITHOUT CONTRAST 12/12/2024 09:20:52 PM TECHNIQUE: CT of the chest was performed without the administration of intravenous contrast.  Multiplanar reformatted images are provided for review. Automated exposure control, iterative reconstruction, and/or weight based adjustment of the mA/kV was utilized to reduce the radiation dose to as low as reasonably achievable. COMPARISON: Chest x-Kaupp from earlier in the same day. CLINICAL HISTORY: Abnormal chest x-Kipp; evaluate for pneumonia. FINDINGS: MEDIASTINUM: Heart: No cardiac enlargement is seen. Heavy coronary calcifications and changes of prior coronary bypass graft are seen. Pericardium is unremarkable. The central airways are clear. The thoracic aorta shows diffuse atherosclerotic calcifications. No aneurysmal dilatation of the aorta is noted. The esophagus demonstrates mild wall thickening, likely related to reflux. LYMPH NODES: No mediastinal, hilar or axillary lymphadenopathy. LUNGS AND PLEURA: Lungs are well aerated bilaterally. Focal infiltrate is noted in the posterior right upper lobe consistent with acute pneumonia. That corresponds to that seen on prior plain film examination. No pulmonary edema. No pleural effusion or pneumothorax. SOFT TISSUES/BONES: No acute abnormality of the bones or soft tissues. UPPER ABDOMEN: Limited images of the upper abdomen demonstrates no acute abnormality. IMPRESSION: 1. Acute pneumonia in the posterior right upper lobe, corresponding to prior plain film examination. Electronically signed by: Oneil Devonshire MD 12/12/2024 09:26 PM EST RP Workstation: [id number]   DG Chest Portable 1 View Result Date: 12/12/2024 CLINICAL DATA:  fever, fatigue, nausea EXAM: PORTABLE CHEST - 1 VIEW COMPARISON:  October 30, 2022 FINDINGS: Hazy masslike consolidation along the right lateral mid lung. No pleural effusion or pneumothorax. No cardiomegaly. Sternotomy wires and CABG markers. Cardiac stent noted. Aortic atherosclerosis. No acute fracture or destructive lesions. Multilevel thoracic osteophytosis. IMPRESSION: Hazy masslike consolidation along the lateral right mid lung,  worrisome for pneumonia in the correct clinical context. Nonemergent chest CT with IV contrast may be of benefit to exclude underlying lung mass. Electronically Signed   By: Rogelia Myers M.D.   On: 12/12/2024 19:17   CT ABDOMEN PELVIS WO CONTRAST Result Date: 12/12/2024 EXAM: CT ABDOMEN AND PELVIS WITHOUT CONTRAST 12/12/2024 07:02:49 PM TECHNIQUE: CT of the abdomen and pelvis was performed without the administration of intravenous contrast. Multiplanar reformatted images are provided for review. Automated exposure control, iterative reconstruction, and/or weight-based adjustment of the mA/kV was utilized to reduce the radiation dose to as low as reasonably achievable. COMPARISON: CT chest abdomen and pelvis 05/17/2022. CLINICAL HISTORY: Abdominal pain, acute, nonlocalized. FINDINGS: LOWER CHEST: There are trace bilateral pleural effusions. LIVER: The liver is unremarkable. GALLBLADDER AND BILE DUCTS: Gallbladder is unremarkable. No biliary ductal dilatation. SPLEEN: Periventricular calcified cyst is seen in the spleen measuring 1 cm, unchanged. Splenules are present. PANCREAS: No acute abnormality. ADRENAL GLANDS: No acute abnormality. KIDNEYS, URETERS AND BLADDER: There is a single punctate calculus in the left kidney. There is  mild nonspecific bilateral perinephric fat stranding which is similar to prior. There are likely small left peripelvic cysts. No hydronephrosis. No perinephric or periureteral stranding. Urinary bladder is unremarkable. GI AND BOWEL: Stomach demonstrates no acute abnormality. There is a small hiatal hernia. There is wall thickening of the distal esophagus. The appendix appears normal. There is no bowel obstruction. PERITONEUM AND RETROPERITONEUM: No ascites. No free air. VASCULATURE: Aorta is normal in caliber. There are atherosclerotic calcifications of the aorta and iliac arteries. LYMPH NODES: No lymphadenopathy. REPRODUCTIVE ORGANS: Prostate gland is mildly enlarged. BONES AND  SOFT TISSUES: No acute osseous abnormality. There are small fat containing bilateral inguinal hernias. IMPRESSION: 1. No acute findings in the abdomen or pelvis. 2. Small hiatal hernia with distal esophageal wall thickening; consider endoscopic evaluation. 3. Punctate nonobstructing left renal calculus. 4. Trace bilateral pleural effusions. Electronically signed by: Greig Pique MD 12/12/2024 07:14 PM EST RP Workstation: [id number]    Assessment and Plan: Patient Active Problem List   Diagnosis Date Noted   CAP (community acquired pneumonia) 12/12/2024   Acute renal failure superimposed on stage 3b chronic kidney disease (HCC) 12/12/2024   HLD (hyperlipidemia) 12/12/2024   CAD (coronary artery disease) 12/12/2024   Type II diabetes mellitus with renal manifestations (HCC) 12/12/2024   Chronic diastolic CHF (congestive heart failure) (HCC) 12/12/2024   BPH (benign prostatic hyperplasia) 12/12/2024   Carotid stenosis, right 03/11/2023   Carotid stenosis, symptomatic, with infarction (HCC) 12/17/2022   Carotid stenosis 12/08/2022   Aphasia 10/31/2022   Dysarthria 10/30/2022   Hypertension associated with diabetes (HCC) 10/30/2022   Known medical problems 10/30/2022   Bilateral carotid artery stenosis 05/19/2022   Chronic kidney disease, stage 3b (HCC) 04/27/2022   Diabetic peripheral neuropathy associated with type 2 diabetes mellitus (HCC) 04/07/2022   Chronic kidney disease due to type 2 diabetes mellitus (HCC) 04/07/2022   Claudication 01/19/2019   Coronary artery disease involving coronary bypass graft of native heart without angina pectoris 08/15/2016   Type 2 diabetes mellitus with other circulatory complications (HCC) 05/09/2016   Coronary arteriosclerosis 11/08/2012    1. Pneumonia of right lower lobe due to Streptococcus pneumoniae (Primary) Clinically doing better. Will get a follow up Ct chest determine resolution  2. Chronic diastolic CHF (congestive heart failure)  (HCC) Has been compensated and will continue to followup with cards  3. Longstanding persistent atrial fibrillation (HCC) As above appt with cards   General Counseling: I have discussed the findings of the evaluation and examination with [facility].  I have also discussed any further diagnostic evaluation thatmay be needed or ordered today. Maya verbalizes understanding of the findings of todays visit. We also reviewed his medications today and discussed drug interactions and side effects including but not limited excessive drowsiness and altered mental states. We also discussed that there is always a risk not just to him but also people around him. he has been encouraged to call the office with any questions or concerns that should arise related to todays visit.  No orders of the defined types were placed in this encounter.    Time spent: 33  I have personally obtained a history, examined the patient, evaluated laboratory and imaging results, formulated the assessment and plan and placed orders.    Elfreda DELENA Bathe, MD [facility] Pulmonary and Critical Care Sleep medicine

## 2024-12-25 NOTE — Patient Instructions (Signed)
 Pneumonitis Pneumonitis is inflammation of the lungs. This condition can be caused by an infection or exposure to certain substances. It can also be caused by things that can give you an allergic reaction (allergens). What are the causes? This condition may be caused by: An infection from bacteria or a virus (pneumonia). Exposure to certain substances in the workplace. These workplaces include farms and certain industries. Some substances that can cause this condition include: Asbestos. Silica. Inhaled acids. Inhaled chlorine gas. Exposure to mold. A hot tub, sauna, or home humidifier can have mold growing in it, even if it looks clean. You can breathe in the mold through water  vapor. Repeated exposure to bird feathers, bird feces, or other allergens. Medicines such as chemotherapy drugs, certain antibiotic medicines, and some heart medicines. Radiation therapy. Breathing stomach contents, food, or liquids into the lungs. What are the signs or symptoms? Symptoms of this condition include: Shortness of breath or trouble breathing. This is the most common symptom. Cough. Fever. Chest tightness. Decreased energy. Decreased appetite. How is this diagnosed? This condition may be diagnosed based on: Your medical history. A physical exam. Blood tests. Other tests, including: A pulmonary function test (PFT). This measures how well your lungs work. A chest X-ray. A CT scan of the lungs. A bronchoscopy. In this procedure, your health care provider looks at your airways through an instrument called a bronchoscope. A lung biopsy. In this procedure, your health care provider takes a small piece of tissue from your lungs to look at it under a microscope. How is this treated? Treatment depends on the cause of the condition. If the cause is exposure to a substance, avoiding further exposure to that substance will help reduce your symptoms. If needed, treatment may include: Corticosteroid  medicine. This can help decrease inflammation. Antibiotic medicine. This can help fight an infection caused by bacteria. Medicines that help relax the muscles and make breathing easier, such as bronchodilators or inhalers. Oxygen therapy. Follow these instructions at home:  Medicines Take over-the-counter and prescription medicines only as told by your health care provider. If you were prescribed an antibiotic medicine, take it as told by your health care provider. Do not stop using the antibiotic even if you start to feel better. If you were prescribed an inhaler, keep it with you at all times. Use it only as told by your health care provider. General instructions Avoid exposure to any substance that caused your pneumonitis. If you need to work with substances that can cause this condition, use an air purifier while working and wear a mask to protect your lungs. Do not use any products that contain nicotine or tobacco. These products include cigarettes, chewing tobacco, and vaping devices, such as e-cigarettes. If you need help quitting, ask your health care provider. Keep all follow-up visits. This is important. Contact a health care provider if: You have a fever. Your symptoms get worse. Get help right away if: You have new or worse shortness of breath. You develop a blue color under your fingernails. Summary Pneumonitis is inflammation of the lungs. This condition can be caused by infection or exposure to certain substances or allergens. The most common symptom of this condition is shortness of breath or trouble breathing. Treatment depends on the cause of your condition. This information is not intended to replace advice given to you by your health care provider. Make sure you discuss any questions you have with your health care provider. Document Revised: 01/04/2022 Document Reviewed: 01/04/2022 Elsevier Patient Education  2024 [employer]

## 2025-01-24 ENCOUNTER — Telehealth: Payer: Self-pay | Admitting: Internal Medicine

## 2025-01-24 NOTE — Telephone Encounter (Signed)
Notified patient of chest CT appointment date, arrival time, location-Toni

## 2025-02-12 ENCOUNTER — Encounter (INDEPENDENT_AMBULATORY_CARE_PROVIDER_SITE_OTHER)

## 2025-02-12 ENCOUNTER — Ambulatory Visit (INDEPENDENT_AMBULATORY_CARE_PROVIDER_SITE_OTHER): Admitting: Nurse Practitioner

## 2025-02-21 ENCOUNTER — Ambulatory Visit

## 2025-03-26 ENCOUNTER — Ambulatory Visit: Admitting: Internal Medicine
